# Patient Record
Sex: Male | Born: 1960 | Race: Black or African American | Hispanic: No | Smoking: Never smoker
Health system: Southern US, Community
[De-identification: ages and names within clinical notes are randomized; demographics above are authoritative.]

## PROBLEM LIST (undated history)

## (undated) DIAGNOSIS — N419 Inflammatory disease of prostate, unspecified: Secondary | ICD-10-CM

## (undated) DIAGNOSIS — E785 Hyperlipidemia, unspecified: Secondary | ICD-10-CM

## (undated) DIAGNOSIS — M1990 Unspecified osteoarthritis, unspecified site: Secondary | ICD-10-CM

## (undated) DIAGNOSIS — K219 Gastro-esophageal reflux disease without esophagitis: Secondary | ICD-10-CM

## (undated) DIAGNOSIS — Z973 Presence of spectacles and contact lenses: Secondary | ICD-10-CM

## (undated) HISTORY — DX: Hyperlipidemia, unspecified: E78.5

## (undated) HISTORY — DX: Presence of spectacles and contact lenses: Z97.3

## (undated) HISTORY — DX: Inflammatory disease of prostate, unspecified: N41.9

## (undated) HISTORY — DX: Unspecified osteoarthritis, unspecified site: M19.90

## (undated) HISTORY — DX: Gastro-esophageal reflux disease without esophagitis: K21.9

---

## 2007-04-02 ENCOUNTER — Emergency Department (HOSPITAL_COMMUNITY): Admission: EM | Admit: 2007-04-02 | Discharge: 2007-04-02 | Payer: Self-pay | Admitting: *Deleted

## 2009-09-21 HISTORY — PX: COLONOSCOPY: SHX174

## 2011-07-07 LAB — LIPASE, BLOOD: Lipase: 27

## 2011-07-07 LAB — CBC
HCT: 44.4
Hemoglobin: 15.1
RDW: 13.4

## 2011-07-07 LAB — POCT CARDIAC MARKERS
CKMB, poc: 1.7
Myoglobin, poc: 120
Operator id: 284141
Operator id: 284141
Troponin i, poc: <0.05

## 2011-07-07 LAB — DIFFERENTIAL
Basophils Absolute: 0
Basophils Relative: 1
Neutro Abs: 1.7
Neutrophils Relative %: 44

## 2011-07-07 LAB — COMPREHENSIVE METABOLIC PANEL
Alkaline Phosphatase: 76
BUN: 11
Chloride: 106
Glucose, Bld: 76
Potassium: 3.9
Total Bilirubin: 1.3 — ABNORMAL HIGH
Total Protein: 6.7

## 2011-07-07 LAB — I-STAT 8, (EC8 V) (CONVERTED LAB)
Acid-Base Excess: 2
Chloride: 109
Glucose, Bld: 80
Potassium: 3.9
pH, Ven: 7.515 — ABNORMAL HIGH

## 2011-07-07 LAB — AMYLASE: Amylase: 90

## 2011-09-01 ENCOUNTER — Ambulatory Visit: Payer: Self-pay

## 2014-08-23 ENCOUNTER — Ambulatory Visit (INDEPENDENT_AMBULATORY_CARE_PROVIDER_SITE_OTHER): Payer: BC Managed Care – PPO | Admitting: Family Medicine

## 2014-08-23 VITALS — BP 132/84 | HR 78 | Temp 98.6°F | Resp 16 | Ht 72.5 in | Wt 218.6 lb

## 2014-08-23 DIAGNOSIS — H60392 Other infective otitis externa, left ear: Secondary | ICD-10-CM

## 2014-08-23 DIAGNOSIS — H9202 Otalgia, left ear: Secondary | ICD-10-CM

## 2014-08-23 MED ORDER — NEOMYCIN-POLYMYXIN-HC 1 % OT SOLN: OTIC | Status: DC

## 2014-08-23 NOTE — Patient Instructions (Signed)
Use 3-4 ear drops about 4 times daily in the left ear.   Return and see Benny LennertSarah Weber PA-C on Saturday between 8 and 3  Tylenol or ibuprofen for pain. Call if you need something stronger for pain.

## 2014-08-23 NOTE — Progress Notes (Signed)
Subjective: Patient is here complaining of pain in his left ear. He's had drainage from the year for the last few days.  Objective: He has small earlobes. His right ear though is normal. The left has a tightly swollen canal. It is draining a yellowish watery substance. This was dried up the best possible and a wick was inserted. The patient was treated with Cortisporin Otic drops. The with was saturated.  Assessment: Otitis externa Left otalgia  Plan: Continue using the drops at home and return in 2 days to have the wick removed and decide whether a new were needs to be inserted or not at that point. 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

## 2014-08-25 ENCOUNTER — Ambulatory Visit (INDEPENDENT_AMBULATORY_CARE_PROVIDER_SITE_OTHER): Payer: BC Managed Care – PPO | Admitting: Physician Assistant

## 2014-08-25 VITALS — BP 120/86 | HR 73 | Temp 98.3°F | Resp 12 | Ht 71.75 in | Wt 212.0 lb

## 2014-08-25 DIAGNOSIS — H60392 Other infective otitis externa, left ear: Secondary | ICD-10-CM

## 2014-08-25 NOTE — Progress Notes (Signed)
   Subjective:    Patient ID: Joshua Torres, male    DOB: [birth year], 53 y.o.   MRN: [id number]  HPI Pt presents to clinic for recheck of his left ear.  The ear feels better - he is still having tinnitus and he has muffled hearing.  He has been using his ear drops without problems.  Prior to Admission medications   Medication Sig Start Date End Date Taking? Authorizing Provider  NEOMYCIN-POLYMYXIN-HYDROCORTISONE (CORTISPORIN) 1 % SOLN otic solution Use 3 or 4 drops in left ear 4 times daily 08/23/14  Yes Peyton Najjaravid H Hopper, MD   No Known Allergies There are no active problems to display for this patient.    Review of Systems     Objective:   Physical Exam  Constitutional: He is oriented to person, place, and time. He appears well-developed and well-nourished.  BP 120/86 mmHg  Pulse 73  Temp(Src) 98.3 F (36.8 C) (Oral)  Resp 12  Ht 5' 11.75" (1.822 m)  Wt 212 lb (96.163 kg)  BMI 28.97 kg/m2  SpO2 99%   HENT:  Head: Normocephalic and atraumatic.  Right Ear: Hearing, tympanic membrane, external ear and ear canal normal.  Left Ear: Hearing, tympanic membrane and external ear normal. Left ear swelling: external canal is swollen without erythema, there is purulence in his canal.  Shonna ChockWick removed.  Left external ear canal is still swollen but it is patent.  Pulmonary/Chest: Effort normal.  Neurological: He is alert and oriented to person, place, and time.  Skin: Skin is warm and dry.  Psychiatric: He has a normal mood and affect. His behavior is normal. Judgment and thought content normal.       Assessment & Plan:  Otitis, externa, infective, left  Ear wick was not placed today because ear canal is patent and he should be able to get the drops in his ear without difficulty.  Benny LennertSarah Jiselle Sheu PA-C  [facility] 08/25/2014 10:51 AM

## 2015-04-01 ENCOUNTER — Encounter: Payer: Self-pay | Admitting: Medical

## 2015-04-01 ENCOUNTER — Ambulatory Visit (INDEPENDENT_AMBULATORY_CARE_PROVIDER_SITE_OTHER): Payer: BLUE CROSS/BLUE SHIELD | Admitting: Medical

## 2015-04-01 VITALS — BP 120/78 | HR 72 | Temp 98.2°F | Resp 16 | Ht 72.0 in | Wt 204.0 lb

## 2015-04-01 DIAGNOSIS — N410 Acute prostatitis: Secondary | ICD-10-CM | POA: Diagnosis not present

## 2015-04-01 LAB — POCT URINALYSIS DIPSTICK
BILIRUBIN UA: NEGATIVE
Blood, UA: NEGATIVE
GLUCOSE UA: NEGATIVE
Ketones, UA: NEGATIVE
LEUKOCYTES UA: NEGATIVE
NITRITE UA: NEGATIVE
Protein, UA: NEGATIVE
Spec Grav, UA: 1.025
UROBILINOGEN UA: NEGATIVE
pH, UA: 6

## 2015-04-01 MED ORDER — CIPROFLOXACIN HCL 500 MG PO TABS: 500.0000 mg | ORAL_TABLET | Freq: Two times a day (BID) | ORAL | Status: DC

## 2015-04-01 NOTE — Progress Notes (Signed)
Subjective: Here as a new patient. Was seeing [facility] prior.  He is here concerned about prostate infection.  He has had this 2 other prior times and current symptom are similar.  He notes 1.5 wk hx/o pain between legs/perineum area, hurts to sit for prolonged period of time.  No other significant symptoms.  No changes in urination, no burning with urination, no back pain, no abdominal pain, no fever, no NVD, no constipation, no penile discharge, no blood in urine, no concern for STD, married.  Last prostate exam > 2 years ago, last colonoscopy 2011/Dr. Loreta AveMann.  No prior hx/o diverticulitis, no hx/o orchitis, no prior UTI, no rectal pain specifically.  No other aggravating or relieving factors. No other complaint.  History reviewed. No pertinent past medical history.  ROS as in subjective  Objective: BP 120/78 mmHg  Pulse 72  Temp(Src) 98.2 F (36.8 C) (Oral)  Resp 16  Ht 6' (1.829 m)  Wt 204 lb (92.534 kg)  BMI 27.66 kg/m2  General appearance: alert, no distress, WD/WN, pleasant AA male Heart: RRR, normal S1, S2, no murmurs Lungs: CTA bilaterally, no wheezes, rhonchi, or rales Abdomen: +bs, soft, non tender, non distended, no masses, no hepatomegaly, no splenomegaly Back: non tender GU: normal male, circumcised, no mass, no hernia Rectal: normal sphincter tone, prostate slightly boggy, no discrete nodules, occult negative stool   Assessment: Encounter Diagnosis  Name Primary?  . Prostatitis, acute Yes    Plan: Discussed diagnosis, treatment, hydration, and begin Cipro.  If not improving in 7-10 days, call back, but plan on 14 days of antibiotics, longer if still symptomatic.  F/u in 55mo for physical as planned.

## 2015-04-04 ENCOUNTER — Telehealth: Payer: Self-pay | Admitting: Internal Medicine

## 2015-04-04 NOTE — Telephone Encounter (Signed)
[facility] [place] for pro[facility]

## 2015-04-10 ENCOUNTER — Telehealth: Payer: Self-pay | Admitting: Family Medicine

## 2015-04-10 NOTE — Telephone Encounter (Signed)
[facility] records from [facility].  Gave to [employer]

## 2015-04-22 HISTORY — PX: NO PAST SURGERIES: SHX2092

## 2015-04-29 ENCOUNTER — Ambulatory Visit (INDEPENDENT_AMBULATORY_CARE_PROVIDER_SITE_OTHER): Payer: BLUE CROSS/BLUE SHIELD | Admitting: Medical

## 2015-04-29 ENCOUNTER — Encounter: Payer: Self-pay | Admitting: Medical

## 2015-04-29 ENCOUNTER — Telehealth: Payer: Self-pay | Admitting: Medical

## 2015-04-29 VITALS — BP 130/90 | HR 76 | Temp 97.9°F | Resp 16 | Ht 73.0 in | Wt 204.4 lb

## 2015-04-29 DIAGNOSIS — N410 Acute prostatitis: Secondary | ICD-10-CM

## 2015-04-29 DIAGNOSIS — Z125 Encounter for screening for malignant neoplasm of prostate: Secondary | ICD-10-CM

## 2015-04-29 DIAGNOSIS — Z1211 Encounter for screening for malignant neoplasm of colon: Secondary | ICD-10-CM

## 2015-04-29 DIAGNOSIS — H18413 Arcus senilis, bilateral: Secondary | ICD-10-CM | POA: Diagnosis not present

## 2015-04-29 DIAGNOSIS — Z23 Encounter for immunization: Secondary | ICD-10-CM | POA: Diagnosis not present

## 2015-04-29 DIAGNOSIS — Z0000 Encounter for general adult medical examination without abnormal findings: Secondary | ICD-10-CM

## 2015-04-29 LAB — POCT URINALYSIS DIPSTICK
BILIRUBIN UA: NEGATIVE
GLUCOSE UA: NEGATIVE
Ketones, UA: NEGATIVE
Leukocytes, UA: NEGATIVE
Nitrite, UA: NEGATIVE
Protein, UA: NEGATIVE
RBC UA: NEGATIVE
Spec Grav, UA: 1.015
Urobilinogen, UA: NEGATIVE
pH, UA: 6

## 2015-04-29 LAB — CBC
HCT: 44.6 % (ref 39.0–52.0)
HEMOGLOBIN: 14.8 g/dL (ref 13.0–17.0)
MCH: 28.8 pg (ref 26.0–34.0)
MCHC: 33.2 g/dL (ref 30.0–36.0)
MCV: 86.9 fL (ref 78.0–100.0)
MPV: 10.8 fL (ref 8.6–12.4)
PLATELETS: 149 10*3/uL — AB (ref 150–400)
RBC: 5.13 MIL/uL (ref 4.22–5.81)
RDW: 14.1 % (ref 11.5–15.5)
WBC: 4.6 10*3/uL (ref 4.0–10.5)

## 2015-04-29 LAB — COMPREHENSIVE METABOLIC PANEL
ALK PHOS: 71 U/L (ref 40–115)
ALT: 20 U/L (ref 9–46)
AST: 20 U/L (ref 10–35)
Albumin: 4 g/dL (ref 3.6–5.1)
BILIRUBIN TOTAL: 1.2 mg/dL (ref 0.2–1.2)
BUN: 14 mg/dL (ref 7–25)
CALCIUM: 9.2 mg/dL (ref 8.6–10.3)
CO2: 26 mmol/L (ref 20–31)
Chloride: 104 mmol/L (ref 98–110)
Creat: 1.06 mg/dL (ref 0.70–1.33)
GLUCOSE: 83 mg/dL (ref 65–99)
POTASSIUM: 4.1 mmol/L (ref 3.5–5.3)
SODIUM: 141 mmol/L (ref 135–146)
Total Protein: 6.6 g/dL (ref 6.1–8.1)

## 2015-04-29 LAB — LIPID PANEL
CHOL/HDL RATIO: 4.1 ratio (ref <=5.0)
Cholesterol: 250 mg/dL — ABNORMAL HIGH (ref 125–200)
HDL: 61 mg/dL (ref >=40)
LDL Cholesterol: 164 mg/dL — ABNORMAL HIGH (ref <130)
TRIGLYCERIDES: 127 mg/dL (ref <150)
VLDL: 25 mg/dL (ref <30)

## 2015-04-29 MED ORDER — CIPROFLOXACIN HCL 500 MG PO TABS: 500.0000 mg | ORAL_TABLET | Freq: Two times a day (BID) | ORAL | Status: DC

## 2015-04-29 NOTE — Progress Notes (Signed)
Subjective:   HPI  Joshua Torres is a 54 y.o. male who presents for a complete physical.   Preventative care: Last ophthalmology visit: yearly Last colonoscopy: 5 years ago, due now Last prostate exam: recently last visit Last EKG: unknown Last labs: today, fasting  Prior vaccinations: TD or Tdap: ?  Concerns: Only concern is after last visit, felt much better on cipro x 2 wk, then as soon as he ran out symptoms came back.  He is here concerned about ongoing prostate infection. He has had this 2 other prior times and current symptom are similar. He notes prior to last visit 1.5 wk hx/o pain between legs/perineum area, hurts to sit for prolonged period of time. No other significant symptoms. No changes in urination, no burning with urination, no back pain, no abdominal pain, no fever, no NVD, no constipation, no penile discharge, no blood in urine, no concern for STD, married. Last prostate exam > 2 years ago, last colonoscopy 2011/Dr. Loreta Ave. No prior hx/o diverticulitis, no hx/o orchitis, no prior UTI, no rectal pain specifically. No other aggravating or relieving factors. No other complaint.  Reviewed their medical, surgical, family, social, medication, and allergy history and updated chart as appropriate.  Past Medical History  Diagnosis Date  . Wears glasses   . GERD (gastroesophageal reflux disease)   . Prostatitis   . Osteoarthritis     left middle finger    Past Surgical History  Procedure Laterality Date  . No past surgeries  04/2015  . Colonoscopy  2011    Dr. Loreta Ave, q5 years, so due as of 2016    History   Social History  . Marital Status: Married    Spouse Name: N/A  . Number of Children: N/A  . Years of Education: N/A   Occupational History  . Not on file.   Social History Main Topics  . Smoking status: Never Smoker   . Smokeless tobacco: Never Used  . Alcohol Use: No  . Drug Use: No  . Sexual Activity: Not on file   Other Topics Concern  . 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   Exercise - stretching, walking.  Attends church.      Family History  Problem Relation Age of Onset  . Cancer Father     brain  . Stroke Father   . Diabetes Sister   . Diabetes Mother   . Other Brother     death related to a fall, head contusion  . Asthma Sister   . Heart disease Neg Hx      Current outpatient prescriptions:  .  ciprofloxacin (CIPRO) 500 MG tablet, Take 1 tablet (500 mg total) by mouth 2 (two) times daily., Disp: 42 tablet, Rfl: 0  No Known Allergies   Review of Systems Constitutional: -fever, -chills, -sweats, -unexpected weight change, -decreased appetite, -fatigue Allergy: -sneezing, -itching, -congestion Dermatology: -changing moles, --rash, -lumps ENT: -runny nose, -ear pain, -sore throat, -hoarseness, -sinus pain, -teeth pain, - ringing in ears, -hearing loss, -nosebleeds Cardiology: -chest pain, -palpitations, -swelling, -difficulty breathing when lying flat, -waking up short of breath Respiratory: -cough, -shortness of breath, -difficulty breathing with exercise or exertion, -wheezing, -coughing up blood Gastroenterology: -abdominal pain, -nausea, -vomiting, -diarrhea, -constipation, -blood in stool, -changes in bowel movement, -difficulty swallowing or eating Hematology: -bleeding, -bruising  Musculoskeletal: -joint aches, -muscle aches, -joint swelling, -back pain, -neck pain, -cramping, -changes in gait Ophthalmology: denies vision changes,  eye redness, itching, discharge Urology: -burning with urination, -difficulty urinating, -blood in urine, -urinary frequency, -urgency, -incontinence Neurology: -headache, -weakness, -tingling, -numbness, -memory loss, -falls, -dizziness Psychology: -depressed mood, -agitation, -sleep problems     Objective:   Physical Exam  BP 130/90 mmHg  Pulse 76  Temp(Src) 97.9 F (36.6 C)  Resp 16  Ht  (1.854 m)  Wt 204 lb  6.4 oz (92.715 kg)  BMI 26.97 kg/m2  General appearance: alert, no distress, WD/WN, AA male Skin: left dorsal hand with 3mm brown flat macule unchanged per patient, left upper abdomen 2mm brown flat diamond shape macule, otherwise few scattered macules, no worrisome lesions HEENT: normocephalic, bluish hugh concentric rings around perimeter of both iris, conjunctiva/corneas normal, sclerae anicteric, PERRLA, EOMi, nares patent, no discharge or erythema, pharynx normal Oral cavity: MMM, tongue normal, teeth in good repair Neck: supple, no lymphadenopathy, no thyromegaly, no masses, normal ROM Chest: non tender, normal shape and expansion Heart: RRR, normal S1, S2, no murmurs Lungs: CTA bilaterally, no wheezes, rhonchi, or rales Abdomen: +bs, soft, non tender, non distended, no masses, no hepatomegaly, no splenomegaly, no bruits Back: non tender, normal ROM, no scoliosis Musculoskeletal: upper extremities non tender, no obvious deformity, normal ROM throughout, lower extremities non tender, no obvious deformity, normal ROM throughout Extremities: no edema, no cyanosis, no clubbing Pulses: 2+ symmetric, upper and lower extremities, normal cap refill Neurological: alert, oriented x 3, CN2-12 intact, strength normal upper extremities and lower extremities, sensation normal throughout, DTRs 2+ throughout, no cerebellar signs, gait normal Psychiatric: normal affect, behavior normal, pleasant  GU: normal male external genitalia, circumcised, nontender, no masses, no hernia, no lymphadenopathy Rectal: deferred, done last visit   Adult ECG Report  Indication: physical  Rate: 65 bpm  Rhythm: normal sinus rhythm  QRS Axis: 24 degrees  PR Interval: 152 ms  QRS Duration: 96ms  QTc:  Conduction Disturbances: none  Other Abnormalities: none  Patient's cardiac risk factors are: dyslipidemia and male gender.  EKG comparison: none  Narrative Interpretation: normal EKG    Assessment and Plan :     Encounter Diagnoses  Name Primary?  . Encounter for health maintenance examination in adult Yes  . Acute prostatitis   . Special screening for malignant neoplasms, colon   . Screening for prostate cancer   . Need for Tdap vaccination     Physical exam - discussed healthy lifestyle, diet, exercise, preventative care, vaccinations, and addressed their concerns.    Counseled on the Td (tetanus, diptheria) vaccine.  Vaccine information sheet given. Td vaccine given after consent obtained.  Recommendations:  See your eye doctor yearly for routine vision care.  See your dentist yearly for routine dental care including hygiene visits twice yearly.  We will refer you back to Dr. Loreta Ave for updated colonoscopy  Exercise regularly  Eat a healthy low fat diet  We will call with lab results  We updated your tetanus, diptheria vaccine today  Restart Cipro for prostatitis.   Plan to call back in 2-3 weeks to let me know how you are doing.    Follow-up pending labs

## 2015-04-29 NOTE — Patient Instructions (Signed)
Encounter Diagnoses  Name Primary?  . Encounter for health maintenance examination in adult Yes  . Acute prostatitis   . Special screening for malignant neoplasms, colon   . Screening for prostate cancer   . Need for Tdap vaccination    Recommendations:  See your eye doctor yearly for routine vision care.  See your dentist yearly for routine dental care including hygiene visits twice yearly.  We will refer you back to Dr. Loreta Ave for updated colonoscopy  Exercise regularly  Eat a healthy low fat diet  We will call with lab results  We updated your tetanus, diptheria and pertussis vaccine today  Restart Cipro for prostatitis.   Plan to call back in 2-3 weeks to let me know how you are doing.

## 2015-04-29 NOTE — Telephone Encounter (Signed)
Refer back to Dr. Mann for updated colonoscopy 

## 2015-04-30 ENCOUNTER — Other Ambulatory Visit: Payer: Self-pay | Admitting: Medical

## 2015-04-30 ENCOUNTER — Other Ambulatory Visit: Payer: Self-pay

## 2015-04-30 LAB — PSA: PSA: 0.64 ng/mL (ref <=4.00)

## 2015-04-30 MED ORDER — ATORVASTATIN CALCIUM 20 MG PO TABS: 20.0000 mg | ORAL_TABLET | Freq: Every day | ORAL | Status: DC

## 2015-04-30 NOTE — Telephone Encounter (Signed)
PT IS NOT DUE TILL 09/2016 PER DR.[facility] OFFICE

## 2015-05-01 NOTE — Telephone Encounter (Signed)
Ok, make sure he is aware

## 2015-05-02 NOTE — Telephone Encounter (Signed)
Pt is aware.  

## 2015-05-28 ENCOUNTER — Telehealth: Payer: Self-pay | Admitting: Medical

## 2015-05-28 NOTE — Telephone Encounter (Signed)
Pt states that Cipro worked initially however all same symptoms have came back now. Can he get a different med?

## 2015-05-29 ENCOUNTER — Other Ambulatory Visit: Payer: Self-pay | Admitting: Medical

## 2015-05-29 MED ORDER — DOXYCYCLINE HYCLATE 50 MG PO CAPS: 50.0000 mg | ORAL_CAPSULE | Freq: Two times a day (BID) | ORAL | Status: DC

## 2015-05-29 NOTE — Telephone Encounter (Signed)
Begin Doxycycline and f/u in 2wk.  schedule him an OV

## 2015-05-29 NOTE — Telephone Encounter (Signed)
Called pt to advise that med sent to pharmacy. Scheduled 2 wk fu appt

## 2015-06-12 ENCOUNTER — Encounter: Payer: Self-pay | Admitting: Medical

## 2015-06-12 ENCOUNTER — Ambulatory Visit (INDEPENDENT_AMBULATORY_CARE_PROVIDER_SITE_OTHER): Payer: BLUE CROSS/BLUE SHIELD | Admitting: Medical

## 2015-06-12 VITALS — BP 130/94 | HR 72 | Temp 98.3°F | Resp 16 | Wt 210.0 lb

## 2015-06-12 DIAGNOSIS — N411 Chronic prostatitis: Secondary | ICD-10-CM | POA: Diagnosis not present

## 2015-06-12 MED ORDER — TAMSULOSIN HCL 0.4 MG PO CAPS: 0.4000 mg | ORAL_CAPSULE | Freq: Every day | ORAL | Status: DC

## 2015-06-12 NOTE — Progress Notes (Signed)
Subjective: Chief Complaint  Patient presents with  . Follow-up    med check   Since July has had recurrent prostatitis despite 2 rounds of Cipro, currently halfway throughout doxycyline round, and improved, but worried about recurrence when he stops the doxy.  Sex seems to aggravate symptoms.  He is married.  He has had 2 other prior flares of prostatitis before 03/2015.    Past Medical History  Diagnosis Date  . Wears glasses   . GERD (gastroesophageal reflux disease)   . Prostatitis   . Osteoarthritis     left middle finger   ROS as in subjective   Objective: BP 130/94 mmHg  Pulse 72  Temp(Src) 98.3 F (36.8 C)  Resp 16  Wt 210 lb (95.255 kg)  Gen: wd, wn, nad   Assessment: Encounter Diagnosis  Name Primary?  . Chronic prostatitis Yes     Plan: Finish doxycyline. Of note he has completed 2 rounds of cipro since July, and halfwary through round of Doxycycline.   Begin Flomax.   abstains for sex or masturbation.   Hydrate well.  If not much improved in 6wk, then refer to urology  F/u in December fasting for hyperlipidemia/med check.

## 2015-06-13 ENCOUNTER — Encounter: Payer: Self-pay | Admitting: Medical

## 2016-09-21 HISTORY — PX: COLONOSCOPY: SHX174

## 2016-11-23 ENCOUNTER — Ambulatory Visit (INDEPENDENT_AMBULATORY_CARE_PROVIDER_SITE_OTHER): Payer: BLUE CROSS/BLUE SHIELD | Admitting: Medical

## 2016-11-23 ENCOUNTER — Encounter: Payer: Self-pay | Admitting: Medical

## 2016-11-23 VITALS — BP 126/82 | HR 75 | Wt 209.6 lb

## 2016-11-23 DIAGNOSIS — N419 Inflammatory disease of prostate, unspecified: Secondary | ICD-10-CM

## 2016-11-23 LAB — POCT URINALYSIS DIPSTICK
Bilirubin, UA: NEGATIVE
Glucose, UA: NEGATIVE
Ketones, UA: NEGATIVE
LEUKOCYTES UA: NEGATIVE
NITRITE UA: NEGATIVE
PH UA: 6
PROTEIN UA: NEGATIVE
RBC UA: NEGATIVE
Spec Grav, UA: 1.02
UROBILINOGEN UA: NEGATIVE

## 2016-11-23 MED ORDER — CIPROFLOXACIN HCL 500 MG PO TABS: 500.0000 mg | ORAL_TABLET | Freq: Two times a day (BID) | ORAL | 0 refills | Status: DC

## 2016-11-23 NOTE — Addendum Note (Signed)
Addended by: Winn JockVALENTINE, Scott Fix N on: 11/23/2016 04:40 PM   Modules accepted: Orders

## 2016-11-23 NOTE — Progress Notes (Signed)
Subjective: Chief Complaint  Patient presents with  . postate infection    postate infection    Here for possible prostate infection.   He reports pain in under scrotum, rectum, hurts to sit down.   No urine changes.   No burning with urination, no urinary frequency.  Does have some urgency maybe?  No blood in urine, no urine color changes.   No urine stream changes.   No fever. Has a bad back in general, so no worse back pain.   No belly pain.  No testicle swelling or pain.  No pain with wiping.  Has BM about every other day. No blood on toilet paper.   Was on Flomax daily but quit taking this 8 months ago.   Can't say that Flomax was helping anything.  No genital rash.  No other aggravating or relieving factors. No other complaint.   Past Medical History:  Diagnosis Date  . GERD (gastroesophageal reflux disease)   . Osteoarthritis    left middle finger  . Prostatitis   . Wears glasses    Current Outpatient Prescriptions on File Prior to Visit  Medication Sig Dispense Refill  . atorvastatin (LIPITOR) 20 MG tablet Take 1 tablet (20 mg total) by mouth daily. (Patient not taking: Reported on 11/23/2016) 90 tablet 1  . doxycycline (VIBRAMYCIN) 50 MG capsule Take 1 capsule (50 mg total) by mouth 2 (two) times daily. (Patient not taking: Reported on 11/23/2016) 28 capsule 0  . tamsulosin (FLOMAX) 0.4 MG CAPS capsule Take 1 capsule (0.4 mg total) by mouth daily. (Patient not taking: Reported on 11/23/2016) 90 capsule 0   No current facility-administered medications on file prior to visit.    ROS as in subjective   Objective: BP 126/82   Pulse 75   Wt 209 lb 9.6 oz (95.1 kg)   SpO2 99%   BMI 27.65 kg/m   Gen: wd, wn, nad Skin: unremarkable Abdomen: +bs, soft, nontender, no mass, no organomegaly GU: normal male, circumcised, nontender, no mass, no lymphadenopathy, small bulge in right inguinal region, possible small inguinal hernia Declined rectal   Assessment Encounter Diagnosis  Name  Primary?  . Prostatitis, unspecified prostatitis type Yes    Plan Reviewed 2016 notes for prostatitis visits.  He has used cipro and doxycycline prior for same.  Begin trial of cipro.  Call report within 2 wk.  He declines improvements on Flomax prior, so hold off on this for now.     F/u 2wk.

## 2016-12-03 ENCOUNTER — Telehealth: Payer: Self-pay | Admitting: Medical

## 2016-12-03 NOTE — Telephone Encounter (Signed)
Pt called and wanted to give you a call report, states he feels better pain level is only a 3 out of 10,, states he has 5 days left of the cipro, pt can be reached at [phone]

## 2016-12-04 ENCOUNTER — Other Ambulatory Visit: Payer: Self-pay | Admitting: Medical

## 2016-12-04 MED ORDER — CIPROFLOXACIN HCL 500 MG PO TABS: 500.0000 mg | ORAL_TABLET | Freq: Two times a day (BID) | ORAL | 0 refills | Status: DC

## 2016-12-04 NOTE — Telephone Encounter (Signed)
Ok, lets go another round on cipro.  Med sent

## 2016-12-11 ENCOUNTER — Telehealth: Payer: Self-pay | Admitting: Medical

## 2016-12-11 ENCOUNTER — Other Ambulatory Visit: Payer: Self-pay | Admitting: Medical

## 2016-12-11 MED ORDER — CIPROFLOXACIN HCL 500 MG PO TABS: 500.0000 mg | ORAL_TABLET | Freq: Two times a day (BID) | ORAL | 0 refills | Status: DC

## 2016-12-11 NOTE — Telephone Encounter (Signed)
Do another round of cipro and f/u 2wk.   Med sent in

## 2016-12-11 NOTE — Telephone Encounter (Signed)
Pt states was prostate getting better, has taken all the meds, now getting worse again every day, re-occuring.  Didn't completely go away.  Wants to know if you want to refill or change to something different.  [facility]

## 2016-12-14 ENCOUNTER — Encounter: Payer: Self-pay | Admitting: Medical

## 2016-12-14 ENCOUNTER — Ambulatory Visit (INDEPENDENT_AMBULATORY_CARE_PROVIDER_SITE_OTHER): Payer: BLUE CROSS/BLUE SHIELD | Admitting: Medical

## 2016-12-14 VITALS — BP 138/88 | HR 79 | Wt 208.4 lb

## 2016-12-14 DIAGNOSIS — T50905A Adverse effect of unspecified drugs, medicaments and biological substances, initial encounter: Secondary | ICD-10-CM

## 2016-12-14 DIAGNOSIS — N411 Chronic prostatitis: Secondary | ICD-10-CM

## 2016-12-14 DIAGNOSIS — M7660 Achilles tendinitis, unspecified leg: Secondary | ICD-10-CM | POA: Diagnosis not present

## 2016-12-14 DIAGNOSIS — T887XXA Unspecified adverse effect of drug or medicament, initial encounter: Secondary | ICD-10-CM | POA: Diagnosis not present

## 2016-12-14 DIAGNOSIS — M6788 Other specified disorders of synovium and tendon, other site: Secondary | ICD-10-CM

## 2016-12-14 MED ORDER — DOXYCYCLINE HYCLATE 100 MG PO TABS: 100.0000 mg | ORAL_TABLET | Freq: Two times a day (BID) | ORAL | 0 refills | Status: DC

## 2016-12-14 MED ORDER — FINASTERIDE 5 MG PO TABS: 5.0000 mg | ORAL_TABLET | Freq: Every day | ORAL | 2 refills | Status: DC

## 2016-12-14 NOTE — Progress Notes (Signed)
Subjective: Chief Complaint  Patient presents with  . reaction to antibotic    ankle pain and pain in pinky finger  this start on the second round    I saw him 11/23/16 for prostatitis.   Prior to this visit, he had completed 1 round of Cipro x 2 weeks, then he called back Friday, had gotten some better, but when medication ran out, prostate symptoms returned.  Since he started the second round of Cipro that was just called out been getting pain in achilles, worse first thin in the morning.  No recent exercise that was aggravating the feet or ankles.  Also getting pain in left 5th pinky finger, feels like its out of joint.  Improves throughout the day.   Symptom including pain in perineum, worse if sitting.  Is urinating more now.   At night ok, but worse during the work day.   Drinks a lot of water in general.   No burning with urination. No blood in urine, no urine color changes.   No urine stream changes.  sometimes stream is weaker than at other times.  No fever. Has a bad back in general, so no worse back pain.   No belly pain.  No testicle swelling or pain.  No pain with wiping.  Has BM about every other day. No blood on toilet paper.  Bowels seems to be fine.  Was on Flomax daily but quit taking this 8 months ago.   Can't say that Flomax was helping anything.  No genital rash.  No other aggravating or relieving factors. No other complaint.  Past Medical History:  Diagnosis Date  . GERD (gastroesophageal reflux disease)   . Osteoarthritis    left middle finger  . Prostatitis   . Wears glasses    No current outpatient prescriptions on file prior to visit.   No current facility-administered medications on file prior to visit.    Past Surgical History:  Procedure Laterality Date  . COLONOSCOPY  2011   Dr. Loreta Ave, q5 years, so due as of 2016  . NO PAST SURGERIES  04/2015    ROS as in subjective   Objective: BP 138/88   Pulse 79   Wt 208 lb 6.4 oz (94.5 kg)   SpO2 98%   BMI 27.50  kg/m   Wt Readings from Last 3 Encounters:  12/14/16 208 lb 6.4 oz (94.5 kg)  11/23/16 209 lb 9.6 oz (95.1 kg)  06/12/15 210 lb (95.3 kg)   Gen: wd, wn, nad Skin: unremarkable Abdomen: +bs, soft, nontender, no mass, no organomegaly Back: nontender MSK: bilat 5th finger distal phalanx flexion deformity symmetrically otherwise arms, hands, hips, legs ankles without deformity, normal ROM,no swelling UE and LE neurovascularly intact GU: normal male, circumcised, nontender, no mass, no lymphadenopathy, small bulge in right inguinal region, possible small inguinal hernia DRE: anus normal tone, normal appearing, prostate mildly enlarged, no nodules, no swelling    Assessment Encounter Diagnoses  Name Primary?  . Chronic prostatitis Yes  . Achilles tendinosis   . Adverse effect of drug, initial encounter     Plan Reviewed 2016 notes for prostatitis visits.  He has used Cipro and doxycycline prior for same. Stop Cipro given the tendon symptoms. discussed potential risks of Cipro. Begin doxycyline, and begin Proscar.  Discussed risks/benefits of proscar, proper use of medication.   he never really noticed a benefit with Flomax.       Joshua Torres was seen today for reaction to antibotic.  Diagnoses  and all orders for this visit:  Chronic prostatitis  Achilles tendinosis  Adverse effect of drug, initial encounter  Other orders -     doxycycline (VIBRA-TABS) 100 MG tablet; Take 1 tablet (100 mg total) by mouth 2 (two) times daily. -     finasteride (PROSCAR) 5 MG tablet; Take 1 tablet (5 mg total) by mouth daily.   F/u 2 weeks

## 2017-01-04 ENCOUNTER — Ambulatory Visit: Payer: Self-pay | Admitting: Medical

## 2017-01-11 ENCOUNTER — Encounter: Payer: Self-pay | Admitting: Medical

## 2017-01-11 ENCOUNTER — Ambulatory Visit (INDEPENDENT_AMBULATORY_CARE_PROVIDER_SITE_OTHER): Payer: BLUE CROSS/BLUE SHIELD | Admitting: Medical

## 2017-01-11 ENCOUNTER — Telehealth: Payer: Self-pay | Admitting: Medical

## 2017-01-11 VITALS — BP 126/74 | HR 64 | Wt 209.6 lb

## 2017-01-11 DIAGNOSIS — N401 Enlarged prostate with lower urinary tract symptoms: Secondary | ICD-10-CM

## 2017-01-11 DIAGNOSIS — N411 Chronic prostatitis: Secondary | ICD-10-CM | POA: Diagnosis not present

## 2017-01-11 DIAGNOSIS — N3943 Post-void dribbling: Secondary | ICD-10-CM | POA: Diagnosis not present

## 2017-01-11 DIAGNOSIS — K921 Melena: Secondary | ICD-10-CM | POA: Diagnosis not present

## 2017-01-11 DIAGNOSIS — K6289 Other specified diseases of anus and rectum: Secondary | ICD-10-CM

## 2017-01-11 MED ORDER — HYDROCORTISONE ACETATE 25 MG RE SUPP: 25.0000 mg | Freq: Two times a day (BID) | RECTAL | 0 refills | Status: DC

## 2017-01-11 NOTE — Progress Notes (Signed)
Subjective: Chief Complaint  Patient presents with  . discuss prostate     discuss prostate    Here for recheck on prostate issues. I treated him twice in March, once with Cipro, once with doxycycline for prostatitis.  He still notes some pelvic and rectal discomfort and post void dribbling.  Since last visit after starting finasteride he notes improvements in nocturia and stream, but still having dribbling.   He has new c/o having some blood in stool as of recent, typically bright red.  Just started 3 weeks ago.  Just sees this on toilet paper.   No rectal pain with defecation, but does have pain sitting or after defecating.  Denies hemorrhoid poking out.   No prior hemorrhoid surgery.   Last colonoscopy 5 years ago, has repeat 02/01/17.  Just saw Dr. Loreta Ave for consult on this.  No other urinary c/o.  No other aggravating or relieving factors. No other complaint.  Past Medical History:  Diagnosis Date  . GERD (gastroesophageal reflux disease)   . Osteoarthritis    left middle finger  . Prostatitis   . Wears glasses    Current Outpatient Prescriptions on File Prior to Visit  Medication Sig Dispense Refill  . finasteride (PROSCAR) 5 MG tablet Take 1 tablet (5 mg total) by mouth daily. 30 tablet 2   No current facility-administered medications on file prior to visit.    ROS as in subjective  Objective: BP 126/74   Pulse 64   Wt 209 lb 9.6 oz (95.1 kg)   SpO2 97%   BMI 27.65 kg/m   Gen: wd, wn, nad Abdomen: nontender, no mass, no organomegaly Back nontender Anus normal tone, nontender, no obvious external hemorrhoids   Assessment Encounter Diagnoses  Name Primary?  . Chronic prostatitis Yes  . Rectal discomfort   . Blood in stool   . Benign prostatic hyperplasia with post-void dribbling     Plan: PSA lab today, refer to urology  He has appt for colonoscopy in mid May Advised good water and fiber intake C/t finasteride that we recently started Will request records (labs  and notes) from Dr. Lavonia Drafts.    He can use Anusol suppository prn for blood in stool, likely internal hemorrhoid, but can't rule out other, so f/u with GI as scheduled  Tayvin was seen today for discuss prostate.  Diagnoses and all orders for this visit:  Chronic prostatitis -     PSA -     Ambulatory referral to Urology  Rectal discomfort -     PSA -     Ambulatory referral to Urology  Blood in stool  Benign prostatic hyperplasia with post-void dribbling -     PSA -     Ambulatory referral to Urology  Other orders -     hydrocortisone (ANUSOL-HC) 25 MG suppository; Place 1 suppository (25 mg total) rectally 2 (two) times daily.

## 2017-01-11 NOTE — Telephone Encounter (Signed)
Pt called and states Anusol is too expensive.  Can you call something else in?  Please advise pt [phone] or [phone]

## 2017-01-11 NOTE — Telephone Encounter (Signed)
pls call pharmacy and see if they have generic hydrocortisone suppository that is cheaper?  Make sure we referred today to urology

## 2017-01-12 ENCOUNTER — Other Ambulatory Visit: Payer: Self-pay | Admitting: Medical

## 2017-01-12 LAB — PSA: PSA: 0.2 ng/mL (ref <=4.0)

## 2017-01-12 MED ORDER — HYDROCORTISONE 2.5 % RE CREA: 1.0000 "application " | TOPICAL_CREAM | Freq: Two times a day (BID) | RECTAL | 0 refills | Status: DC

## 2017-01-12 NOTE — Telephone Encounter (Signed)
I sent a cream that can be used externally, but what was cost of the cheapest suppositories?  Are the foams any cheaper.   He has inflamed internal hemorrhoids, and the cream I sent as an option isn't going to do much for internal hemorrhoid.  It would be helpful if the pharmacist gave [name] some options

## 2017-01-12 NOTE — Telephone Encounter (Signed)
Called and spoke with Morrie Sheldon the Pharmacist at [facility] she said that all them at expensive, she that is the generic.

## 2017-01-13 ENCOUNTER — Other Ambulatory Visit: Payer: Self-pay | Admitting: Medical

## 2017-01-13 MED ORDER — PRAMOXINE HCL 1 % RE FOAM: 1.0000 "application " | Freq: Three times a day (TID) | RECTAL | 0 refills | Status: DC | PRN

## 2017-01-13 NOTE — Telephone Encounter (Signed)
I sent a cream or a foam to use.  The foam could be used internal possibly, but unfortunately the suppositories which is what I prefer are too expensive regarding blood in stool and internal hemorrhoids.    Refer as discussed yesterday (see yesterday's phone messages if we haven't referred already

## 2017-01-13 NOTE — Telephone Encounter (Signed)
She said that all  Them are high in cost.

## 2017-01-13 NOTE — Telephone Encounter (Signed)
No the cost $ 110.00 for 12 suppositories.

## 2017-01-13 NOTE — Telephone Encounter (Signed)
Called and let pt know, already sent referral

## 2017-01-14 ENCOUNTER — Telehealth: Payer: Self-pay | Admitting: Medical

## 2017-01-14 NOTE — Telephone Encounter (Signed)
Called pt he said that he has already [facility].I did call [facility] they price was $35.00

## 2017-01-14 NOTE — Telephone Encounter (Signed)
Please call [facility] pharmacy and see if they have a suppository of hydrocortisone or other steroid for inflamed internal hemorrhoid.  Let them know that we tried to prescribe Anusol and proctors suppositories and was told it would be >$80 for either.  I suppose they may have a much cheaper option.  If the do, approve 1 suppository BID x 3-5 days prn for internal hemorrhoid/blood in stool, #12 with no refill

## 2017-01-19 HISTORY — PX: COLONOSCOPY: SHX174

## 2017-01-28 ENCOUNTER — Telehealth: Payer: Self-pay

## 2017-01-28 ENCOUNTER — Other Ambulatory Visit: Payer: Self-pay | Admitting: Medical

## 2017-01-28 MED ORDER — FINASTERIDE 5 MG PO TABS: 5.0000 mg | ORAL_TABLET | Freq: Every day | ORAL | 3 refills | Status: DC

## 2017-01-28 NOTE — Telephone Encounter (Signed)
[facility]ax [facility]. Trixie Rude/RLB

## 2017-02-01 LAB — HM COLONOSCOPY

## 2017-02-02 ENCOUNTER — Encounter: Payer: Self-pay | Admitting: Medical

## 2017-03-15 ENCOUNTER — Encounter: Payer: Self-pay | Admitting: Medical

## 2017-11-29 ENCOUNTER — Ambulatory Visit: Payer: BLUE CROSS/BLUE SHIELD | Admitting: Medical

## 2017-11-29 VITALS — BP 126/78 | HR 78 | Wt 209.8 lb

## 2017-11-29 DIAGNOSIS — M545 Low back pain: Secondary | ICD-10-CM

## 2017-11-29 DIAGNOSIS — G8929 Other chronic pain: Secondary | ICD-10-CM | POA: Diagnosis not present

## 2017-11-29 DIAGNOSIS — R202 Paresthesia of skin: Secondary | ICD-10-CM | POA: Diagnosis not present

## 2017-11-29 DIAGNOSIS — N411 Chronic prostatitis: Secondary | ICD-10-CM | POA: Diagnosis not present

## 2017-11-29 NOTE — Progress Notes (Signed)
Subjective: Chief Complaint  Patient presents with  . having pain and burning feet    burning ,and pain , numbness , night sweats feet x 2 month    Here for a few different concerns.  Went to urgent care recently for prostate infection, was treated with medications.   He has seen urology in the past and recently had follow-up with urology.  He is doing physical therapy per urology recommendations for pelvic issues.  About 4 weeks into the medication doxycycline for prostate infection, started getting burning sensation across his toes.  Getting burning and numbness in feet. These symptoms started end of January.  He denies prior tingling or numbness in the feet.  He is also having some tingling in his lower legs.  He denies history of diabetes, does not use alcohol.  He does have a history of back issues for 14 years.   No prior back surgery.   Lower disc injury years ago.  Gets the sensation in both legs, worse in right.  Burning.  History of epidural steroid injection years ago.  In the last month is felt a little feverish, but that is also the period of time he has had the prostate infection.  Denies history of tuberculosis exposure or risk of tuberculosis exposure  Past Medical History:  Diagnosis Date  . GERD (gastroesophageal reflux disease)   . Osteoarthritis    left middle finger  . Prostatitis   . Wears glasses    Current Outpatient Medications on File Prior to Visit  Medication Sig Dispense Refill  . finasteride (PROSCAR) 5 MG tablet Take 1 tablet (5 mg total) by mouth daily. (Patient not taking: Reported on 11/29/2017) 90 tablet 3  . hydrocortisone (ANUSOL-HC) 2.5 % rectal cream Place 1 application rectally 2 (two) times daily. (Patient not taking: Reported on 11/29/2017) 30 g 0  . hydrocortisone (ANUSOL-HC) 25 MG suppository Place 1 suppository (25 mg total) rectally 2 (two) times daily. (Patient not taking: Reported on 11/29/2017) 12 suppository 0  . pramoxine (PROCTOFOAM) 1 % foam  Place 1 application rectally 3 (three) times daily as needed for itching. (Patient not taking: Reported on 11/29/2017) 15 g 0   No current facility-administered medications on file prior to visit.    Family History  Problem Relation Age of Onset  . Cancer Father        brain  . Stroke Father   . Diabetes Sister   . Diabetes Mother   . Other Brother        death related to a fall, head contusion  . Asthma Sister   . Heart disease Neg Hx    Past Surgical History:  Procedure Laterality Date  . COLONOSCOPY  2011   Dr. Loreta Ave, q5 years, so due as of 2016  . NO PAST SURGERIES  04/2015     ROS as in subjective   Objective: BP 126/78   Pulse 78   Wt 209 lb 12.8 oz (95.2 kg)   SpO2 99%   BMI 27.68 kg/m   General well-developed, well-nourished, no acute distress, African-American male  back non tender, normal range of motion, no deformity  legs non tender, normal range of motion, no swelling or deformity Neuro: Legs with normal sensation, strength and DTRs Lower extremity pulses 2+, normal cap refill Abdomen nontender, no mass, no organomegaly    Assessment: Encounter Diagnoses  Name Primary?  . Paresthesia Yes  . Chronic bilateral low back pain, with sciatica presence unspecified   . Chronic  prostatitis     Plan: We discussed the differential for paresthesias of the legs.  His pulses are normal blood flow appears to be fine.  His strength and sensation today was normal.  I suspect this could be related to chronic low back pain and radicular issue, but cannot rule out other.  Labs today as below.  Consider referral to neurology  Chronic prostatitis-follow-up with urology  Derryl HarborLonnie was seen today for having pain and burning feet.  Diagnoses and all orders for this visit:  Paresthesia -     CBC with Differential/Platelet -     Comprehensive metabolic panel -     Vitamin B12 -     TSH  Chronic bilateral low back pain, with sciatica presence unspecified  Chronic  prostatitis

## 2017-11-30 LAB — CBC WITH DIFFERENTIAL/PLATELET
BASOS ABS: 0.1 10*3/uL (ref 0.0–0.2)
BASOS: 1 %
EOS (ABSOLUTE): 0.1 10*3/uL (ref 0.0–0.4)
Eos: 2 %
Hematocrit: 44.3 % (ref 37.5–51.0)
Hemoglobin: 15.2 g/dL (ref 13.0–17.7)
IMMATURE GRANULOCYTES: 1 %
Immature Grans (Abs): 0 10*3/uL (ref 0.0–0.1)
Lymphocytes Absolute: 3.1 10*3/uL (ref 0.7–3.1)
Lymphs: 52 %
MCH: 29.1 pg (ref 26.6–33.0)
MCHC: 34.3 g/dL (ref 31.5–35.7)
MCV: 85 fL (ref 79–97)
MONOS ABS: 0.4 10*3/uL (ref 0.1–0.9)
Monocytes: 7 %
NEUTROS PCT: 37 %
Neutrophils Absolute: 2.2 10*3/uL (ref 1.4–7.0)
PLATELETS: 168 10*3/uL (ref 150–379)
RBC: 5.23 x10E6/uL (ref 4.14–5.80)
RDW: 14 % (ref 12.3–15.4)
WBC: 6 10*3/uL (ref 3.4–10.8)

## 2017-11-30 LAB — COMPREHENSIVE METABOLIC PANEL
ALT: 45 IU/L — AB (ref 0–44)
AST: 37 IU/L (ref 0–40)
Albumin/Globulin Ratio: 1.7 (ref 1.2–2.2)
Albumin: 4.8 g/dL (ref 3.5–5.5)
Alkaline Phosphatase: 120 IU/L — ABNORMAL HIGH (ref 39–117)
BUN/Creatinine Ratio: 14 (ref 9–20)
BUN: 13 mg/dL (ref 6–24)
Bilirubin Total: 0.7 mg/dL (ref 0.0–1.2)
CALCIUM: 9.3 mg/dL (ref 8.7–10.2)
CO2: 24 mmol/L (ref 20–29)
CREATININE: 0.9 mg/dL (ref 0.76–1.27)
Chloride: 104 mmol/L (ref 96–106)
GFR, EST AFRICAN AMERICAN: 109 mL/min/{1.73_m2} (ref >59)
GFR, EST NON AFRICAN AMERICAN: 94 mL/min/{1.73_m2} (ref >59)
GLUCOSE: 85 mg/dL (ref 65–99)
Globulin, Total: 2.9 g/dL (ref 1.5–4.5)
POTASSIUM: 3.7 mmol/L (ref 3.5–5.2)
Sodium: 143 mmol/L (ref 134–144)
TOTAL PROTEIN: 7.7 g/dL (ref 6.0–8.5)

## 2017-11-30 LAB — TSH: TSH: 3.81 u[IU]/mL (ref 0.450–4.500)

## 2017-11-30 LAB — VITAMIN B12: Vitamin B-12: 546 pg/mL (ref 232–1245)

## 2017-12-01 ENCOUNTER — Other Ambulatory Visit: Payer: Self-pay | Admitting: Medical

## 2017-12-01 DIAGNOSIS — R202 Paresthesia of skin: Secondary | ICD-10-CM

## 2017-12-01 DIAGNOSIS — R748 Abnormal levels of other serum enzymes: Secondary | ICD-10-CM

## 2017-12-02 ENCOUNTER — Encounter: Payer: Self-pay | Admitting: Internal Medicine

## 2017-12-02 ENCOUNTER — Other Ambulatory Visit: Payer: Self-pay | Admitting: Medical

## 2017-12-02 MED ORDER — DOXYCYCLINE HYCLATE 100 MG PO TABS: 100.0000 mg | ORAL_TABLET | Freq: Two times a day (BID) | ORAL | 0 refills | Status: DC

## 2017-12-08 ENCOUNTER — Encounter: Payer: Self-pay | Admitting: Neurology

## 2017-12-09 ENCOUNTER — Ambulatory Visit
Admission: RE | Admit: 2017-12-09 | Discharge: 2017-12-09 | Disposition: A | Discharge status: Home / Self Care | Payer: BLUE CROSS/BLUE SHIELD | Source: Ambulatory Visit | Attending: Medical | Admitting: Medical

## 2017-12-09 DIAGNOSIS — R748 Abnormal levels of other serum enzymes: Secondary | ICD-10-CM

## 2017-12-10 ENCOUNTER — Other Ambulatory Visit: Payer: Self-pay | Admitting: Medical

## 2017-12-10 ENCOUNTER — Telehealth: Payer: Self-pay

## 2017-12-10 ENCOUNTER — Encounter: Payer: Self-pay | Admitting: Medical

## 2017-12-10 DIAGNOSIS — R748 Abnormal levels of other serum enzymes: Secondary | ICD-10-CM

## 2017-12-10 MED ORDER — VITAMIN D 1000 UNITS PO TABS: 1000.0000 [IU] | ORAL_TABLET | Freq: Every day | ORAL | 3 refills | Status: DC

## 2017-12-10 MED ORDER — DOXYCYCLINE HYCLATE 100 MG PO TABS: 100.0000 mg | ORAL_TABLET | Freq: Two times a day (BID) | ORAL | 0 refills | Status: DC

## 2017-12-10 NOTE — Telephone Encounter (Signed)
-----   Message from Jac Canavanavid S Tysinger, PA-C sent at 12/10/2017 10:45 AM EDT ----- His ultrasound was normal.  Given his recent labs which included elevated alkaline phosphatase and liver test, let us have him begin vitamin D 1000 units daily over-the-counter.  Limit or avoid alcohol, eat a healthy low-fat diet and avoid fatty foods, avoid taking lots of Tylenol or ibuprofen regularly, not sure if he was doing this?  Let us plan to recheck hepatic function panel, iron level in 3 months

## 2017-12-10 NOTE — Telephone Encounter (Signed)
Called patient, advised of lab work. Patient stated that he would get the over the counter vitamin D. Also made lab appointment in 3 months.  Patient states that he did talk to Neurologist and they were unable to get him in until July 31st, he states that he is almost out of the medication you gave him, and he states that his feet are still bothering him.

## 2017-12-15 LAB — HEPATITIS PANEL, ACUTE
HEP B S AG: NEGATIVE
Hep A IgM: NEGATIVE
Hep B C IgM: NEGATIVE
Hep C Virus Ab: <0.1 s/co ratio (ref 0.0–0.9)

## 2017-12-15 LAB — SPECIMEN STATUS REPORT

## 2018-01-06 ENCOUNTER — Telehealth: Payer: Self-pay | Admitting: Medical

## 2018-01-06 NOTE — Telephone Encounter (Signed)
Pt is scheduled for [employer] in May. He is asking for a letter excusing him. He states he is in too much pain to sit as required. He does have a appt with neuro but can't get in until July. Pt is aware that Vincenza HewsShane is out of town and will not be back until week after next. Please advise on home or cell phone and if possible provide letter.

## 2018-01-16 NOTE — Telephone Encounter (Signed)
pls write a jury duty letter asking he be excused due to medical condition limiting prolonged sitting.

## 2018-01-17 ENCOUNTER — Encounter: Payer: Self-pay | Admitting: Medical

## 2018-01-17 NOTE — Telephone Encounter (Signed)
Advise pt that note is at front desk

## 2018-01-26 ENCOUNTER — Encounter: Payer: Self-pay | Admitting: Medical

## 2018-02-28 ENCOUNTER — Other Ambulatory Visit: Payer: Self-pay

## 2018-02-28 ENCOUNTER — Encounter: Payer: Self-pay | Admitting: Neurology

## 2018-02-28 ENCOUNTER — Ambulatory Visit: Payer: BLUE CROSS/BLUE SHIELD | Admitting: Neurology

## 2018-02-28 VITALS — BP 140/84 | HR 72 | Ht 73.0 in | Wt 207.4 lb

## 2018-02-28 DIAGNOSIS — M4306 Spondylolysis, lumbar region: Secondary | ICD-10-CM | POA: Insufficient documentation

## 2018-02-28 DIAGNOSIS — M4307 Spondylolysis, lumbosacral region: Secondary | ICD-10-CM | POA: Diagnosis not present

## 2018-02-28 DIAGNOSIS — R202 Paresthesia of skin: Secondary | ICD-10-CM | POA: Diagnosis not present

## 2018-02-28 MED ORDER — CYCLOBENZAPRINE HCL 5 MG PO TABS: 5.0000 mg | ORAL_TABLET | Freq: Every evening | ORAL | 5 refills | Status: DC | PRN

## 2018-02-28 MED ORDER — GABAPENTIN 100 MG PO CAPS: ORAL_CAPSULE | ORAL | 5 refills | Status: DC

## 2018-02-28 NOTE — Progress Notes (Signed)
[facility] Neurology Division Clinic Note - Initial Visit   Date: 02/28/18  Joshua Torres MRN: [id number] DOB: 1961/05/08   Dear Crosby Oyster, PA-C:  Thank you for your kind referral of Joshua Torres for consultation of bilateral feet pain. Although his history is well known to you, please allow [name] to reiterate it for the purpose of our medical record. The patient was accompanied to the clinic by self.   History of Present Illness: Joshua Torres is a 57 y.o. right-handed African American male presenting for evaluation of bilateral feet pain.    He was getting doxycycline for prostatitis in March and after three weeks of antibiotics treatment, he began noticing burning sensation over the dorsum of the feet and over the past few weeks, it has involved the soles of the feet.  Symptoms can vary in duration and location, but tends to involve the right foot more.  He has noticed exercise improves the discomfort, and it is worse with prolonged sitting and walking, which can involve his thighs also. He denies weakness or imbalance.  He has long history of chronic low back pain and was treated with ESI 20+ years ago.  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  He is being treated with prostatitis and having pelvic therapy about once per month.   Out-side paper records, electronic medical record, and images have been reviewed where available and summarized as:  Lab Results  Component Value Date   TSH 3.810 11/29/2017   Lab Results  Component Value Date   VITAMINB12 546 11/29/2017   Lab Results  Component Value Date   CREATININE 0.90 11/29/2017   BUN 13 11/29/2017   NA 143 11/29/2017   K 3.7 11/29/2017   CL 104 11/29/2017   CO2 24 11/29/2017    Past Medical History:  Diagnosis Date  . GERD (gastroesophageal reflux disease)   . Osteoarthritis    left middle finger  . Prostatitis   . Wears glasses     Past Surgical History:  Procedure Laterality Date  .  COLONOSCOPY  2011   Dr. Loreta Ave, q5 years, so due as of 2016  . NO PAST SURGERIES  04/2015     Medications:  Outpatient Encounter Medications as of 02/28/2018  Medication Sig  . cyclobenzaprine (FLEXERIL) 5 MG tablet Take 1 tablet (5 mg total) by mouth at bedtime as needed for muscle spasms.  Marland Kitchen gabapentin (NEURONTIN) 100 MG capsule Take 1 tablet at bedtime x 3 days, then increase to 1 tab twice daily, if you are tolerating, you can increase to 2 tablets twice daily.  . [DISCONTINUED] cholecalciferol (VITAMIN D) 1000 units tablet Take 1 tablet (1,000 Units total) by mouth daily.  . [DISCONTINUED] doxycycline (VIBRA-TABS) 100 MG tablet Take 1 tablet (100 mg total) by mouth 2 (two) times daily.  . [DISCONTINUED] finasteride (PROSCAR) 5 MG tablet Take 1 tablet (5 mg total) by mouth daily. (Patient not taking: Reported on 11/29/2017)  . [DISCONTINUED] hydrocortisone (ANUSOL-HC) 2.5 % rectal cream Place 1 application rectally 2 (two) times daily. (Patient not taking: Reported on 11/29/2017)  . [DISCONTINUED] hydrocortisone (ANUSOL-HC) 25 MG suppository Place 1 suppository (25 mg total) rectally 2 (two) times daily. (Patient not taking: Reported on 11/29/2017)  . [DISCONTINUED] pramoxine (PROCTOFOAM) 1 % foam Place 1 application rectally 3 (three) times daily as needed for itching. (Patient not taking: Reported on 11/29/2017)   No facility-administered encounter medications on file as of 02/28/2018.      Allergies:  Allergies  Allergen Reactions  . Ciprofloxacin     Achilles tendon ache, finger aches    Family History: Family History  Problem Relation Age of Onset  . Cancer Father        brain  . Stroke Father   . Diabetes Sister   . Diabetes Mother   . Other Brother        death related to a fall, head contusion  . Asthma Sister   . Heart disease Neg Hx     Social History: Social History   Tobacco Use  . Smoking status: Never Smoker  . Smokeless tobacco: Never Used  Substance Use  Topics  . Alcohol use: No    Alcohol/week: 0.0 oz  . Drug use: No   Social History   Social History Narrative   Lives with wife in a 2 story home.  Has 2 children. Truck driver for [employer].   Exercise - stretching, walking.  Attends church.  Education: college.     Review of Systems:  CONSTITUTIONAL: No fevers, chills, night sweats, or weight loss.   EYES: No visual changes or eye pain ENT: No hearing changes.  No history of nose bleeds.   RESPIRATORY: No cough, wheezing and shortness of breath.   CARDIOVASCULAR: Negative for chest pain, and palpitations.   GI: Negative for abdominal discomfort, blood in stools or black stools.  No recent change in bowel habits.   GU:  No history of incontinence.   MUSCLOSKELETAL: No history of joint pain or swelling.  No myalgias.   SKIN: Negative for lesions, rash, and itching.   HEMATOLOGY/ONCOLOGY: Negative for prolonged bleeding, bruising easily, and swollen nodes.  No history of cancer.   ENDOCRINE: Negative for cold or heat intolerance, polydipsia or goiter.   PSYCH:  No depression or anxiety symptoms.   NEURO: As Above.   Vital Signs:  BP 140/84   Pulse 72   Ht 6\' 1"  (1.854 m)   Wt 207 lb 6 oz (94.1 kg)   SpO2 98%   BMI 27.36 kg/m    General Medical Exam:   General:  Well appearing, comfortable.   Eyes/ENT: see cranial nerve examination.   Neck: No masses appreciated.  Full range of motion without tenderness.  No carotid bruits. Respiratory:  Clear to auscultation, good air entry bilaterally.   Cardiac:  Regular rate and rhythm, no murmur.   Extremities:  No deformities, edema, or skin discoloration.  Skin:  No rashes or lesions.  Neurological Exam: MENTAL STATUS including orientation to time, place, person, recent and remote memory, attention span and concentration, language, and fund of knowledge is normal.  Speech is not dysarthric.  CRANIAL NERVES: II:  No visual field defects.  Unremarkable fundi.   III-IV-VI: Pupils equal  round and reactive to light.  Normal conjugate, extra-ocular eye movements in all directions of gaze.  No nystagmus.  No ptosis.   V:  Normal facial sensation   VII:  Normal facial symmetry and movements.  VIII:  Normal hearing and vestibular function.   IX-X:  Normal palatal movement.   XI:  Normal shoulder shrug and head rotation.   XII:  Normal tongue strength and range of motion, no deviation or fasciculation.  MOTOR:  No atrophy, fasciculations or abnormal movements.  No pronator drift.  Tone is normal.    Right Upper Extremity:    Left Upper Extremity:    Deltoid  5/5   Deltoid  5/5   Biceps  5/5  Biceps  5/5   Triceps  5/5   Triceps  5/5   Wrist extensors  5/5   Wrist extensors  5/5   Wrist flexors  5/5   Wrist flexors  5/5   Finger extensors  5/5   Finger extensors  5/5   Finger flexors  5/5   Finger flexors  5/5   Dorsal interossei  5/5   Dorsal interossei  5/5   Abductor pollicis  5/5   Abductor pollicis  5/5   Tone (Ashworth scale)  0  Tone (Ashworth scale)  0   Right Lower Extremity:    Left Lower Extremity:    Hip flexors  5/5   Hip flexors  5/5   Hip extensors  5/5   Hip extensors  5/5   Knee flexors  5/5   Knee flexors  5/5   Knee extensors  5/5   Knee extensors  5/5   Dorsiflexors  5/5   Dorsiflexors  5/5   Plantarflexors  5/5   Plantarflexors  5/5   Toe extensors  5/5   Toe extensors  5/5   Toe flexors  4+/5   Toe flexors  4+/5   Tone (Ashworth scale)  0  Tone (Ashworth scale)  0   MSRs:  Right                                                                 Left brachioradialis 2+  brachioradialis 2+  biceps 2+  biceps 2+  triceps 2+  triceps 2+  patellar 3+  patellar 3+  ankle jerk 2+  ankle jerk 2+  Hoffman no  Hoffman no  plantar response down  plantar response down   SENSORY:  Vibration is reduced at the right ankle and great toe, intact on the left.  Pin prick, temperature, and light touch is intact throughout.  Rhomberg's sign absent.    COORDINATION/GAIT: Normal finger-to- nose-finger.  Intact rapid alternating movements bilaterally.  Gait narrow based and stable. Tandem and stressed gait intact.    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  Unlikely to represent neuropathy as symptoms are asymmetrical and reflexes are brisker than expected in the legs/feet.   - Start physical therapy for low back stretching and strengthening  - For burning pain, start gabapentin 100mg  - Take 1 tablet at bedtime x 3 days, then increase to 1 tab twice daily, if tolerating, can increase to 2 tablets twice daily.  - For low back pain, start flexeril 5mg  at bedtime as needed  - Call with update in 6 weeks, if no improvement proceed with MRI lumbar spine  Return to clinic in 3 months    Thank you for allowing me to participate in patient's care.  If I can answer any additional questions, I would be pleased to do so.    Sincerely,    Donika K. Allena Katz, DO

## 2018-02-28 NOTE — Patient Instructions (Signed)
For burning pain, start gabapentin $RemoveBefor [id number]$100mge to 1 tab twice daily, if you are tolerating, you can increase to 2 tablets twice daily.  For low back pain, start flexeril 5mg  at bedtime as needed  Start physical therapy  Call with an update in 6 weeks  Return to clinic in 3 months

## 2018-03-07 ENCOUNTER — Ambulatory Visit: Payer: BLUE CROSS/BLUE SHIELD | Admitting: Medical

## 2018-03-07 ENCOUNTER — Encounter: Payer: Self-pay | Admitting: Medical

## 2018-03-07 VITALS — BP 126/88 | HR 64 | Resp 16 | Ht 73.0 in | Wt 206.6 lb

## 2018-03-07 DIAGNOSIS — R102 Pelvic and perineal pain: Secondary | ICD-10-CM

## 2018-03-07 DIAGNOSIS — R945 Abnormal results of liver function studies: Secondary | ICD-10-CM

## 2018-03-07 DIAGNOSIS — R030 Elevated blood-pressure reading, without diagnosis of hypertension: Secondary | ICD-10-CM | POA: Diagnosis not present

## 2018-03-07 DIAGNOSIS — R748 Abnormal levels of other serum enzymes: Secondary | ICD-10-CM

## 2018-03-07 DIAGNOSIS — R7989 Other specified abnormal findings of blood chemistry: Secondary | ICD-10-CM

## 2018-03-07 DIAGNOSIS — N411 Chronic prostatitis: Secondary | ICD-10-CM

## 2018-03-07 LAB — POCT URINALYSIS DIP (PROADVANTAGE DEVICE)
BILIRUBIN UA: NEGATIVE
BILIRUBIN UA: NEGATIVE mg/dL
Blood, UA: NEGATIVE
Glucose, UA: NEGATIVE mg/dL
Leukocytes, UA: NEGATIVE
Nitrite, UA: NEGATIVE
PH UA: 6 (ref 5.0–8.0)
Protein Ur, POC: NEGATIVE mg/dL
SPECIFIC GRAVITY, URINE: 1.015
Urobilinogen, Ur: NEGATIVE

## 2018-03-07 NOTE — Progress Notes (Signed)
Subjective: Chief Complaint  Patient presents with  . Follow-up    1 month follow up    Here for f/u.     Thinks he has another prostate infection going on.  Had sex this weekend, and starting to feel symptoms he has gotten like before with prostatitis.   He has a script of Doxycycline from prior the didn't use.  He just started using this.   Married.   No concern for STD.  Wife has liver cancer, and she is on chemotherapy.  He reports tingling in penis, discomfort in prostate/rectal area.   Sitting is uncomfortable.  No burning with urination, no urinary frequency, no blood in urine.  This is the first time he had sex in months.   No penile discharge.    Saw neurology recently about his leg numbness, was put on Gabapentin and flexeril.    Saw urology 02/2017, Dr. Patsi Sears, seem to have issues with pelvic floor discomfort and recurrent prostatitis.   Was referred to pelvic floor PT.  Sees PT once monthly currently for this ongoing pelvic floor issue.    Regarding elevated BP, no chest pain, no dyspnea, no SOB, no palpitations.   No prior therapy for HTN.    Past Medical History:  Diagnosis Date  . GERD (gastroesophageal reflux disease)   . Osteoarthritis    left middle finger  . Prostatitis   . Wears glasses    Current Outpatient Medications on File Prior to Visit  Medication Sig Dispense Refill  . cyclobenzaprine (FLEXERIL) 5 MG tablet Take 1 tablet (5 mg total) by mouth at bedtime as needed for muscle spasms. 30 tablet 5  . doxycycline (VIBRA-TABS) 100 MG tablet Take 100 mg by mouth 2 (two) times daily.    Marland Kitchen gabapentin (NEURONTIN) 100 MG capsule Take 1 tablet at bedtime x 3 days, then increase to 1 tab twice daily, if you are tolerating, you can increase to 2 tablets twice daily. 90 capsule 5   No current facility-administered medications on file prior to visit.    ROS as in subjective    Objective: BP 130/88   Pulse 64   Resp 16   Ht 6\' 1"  (1.854 m)   Wt 206 lb 9.6 oz  (93.7 kg)   SpO2 98%   BMI 27.26 kg/m   Wt Readings from Last 3 Encounters:  03/07/18 206 lb 9.6 oz (93.7 kg)  02/28/18 207 lb 6 oz (94.1 kg)  11/29/17 209 lb 12.8 oz (95.2 kg)   BP Readings from Last 3 Encounters:  03/07/18 130/88  02/28/18 140/84  11/29/17 126/78     General appearance: alert, no distress, WD/WN,  Neck: supple, no lymphadenopathy, no thyromegaly, no masses Heart: RRR, normal S1, S2, no murmurs Lungs: CTA bilaterally, no wheezes, rhonchi, or rales Abdomen: +bs, soft, non tender, non distended, no masses, no hepatomegaly, no splenomegaly Extremities: no edema, no cyanosis, no clubbing Pulses: 2+ symmetric, upper and lower extremities, normal cap refill Neurological: alert, oriented x 3, CN2-12 intact, strength normal upper extremities and lower extremities, sensation normal throughout, DTRs 2+ throughout, no cerebellar signs, gait normal Psychiatric: normal affect, behavior normal, pleasant  GU/DRE - deferred   Assessment: Encounter Diagnoses  Name Primary?  . Elevated alkaline phosphatase level Yes  . Elevated LFTs   . Chronic prostatitis   . Pelvic pain   . Elevated blood-pressure reading without diagnosis of hypertension        Plan: I reviewed labs from 11/2017, CBC normal, negative  hepatitis screen, elevated ALP and ALT, normal B12 and TSH.  Recheck labs today regarding elevated ALP and ALT last viist.  Liver ultrasound was normal 11/2017  PSA level today  Given ongoing pelvic flood dysfunction and chronic prostatitis vs urinary tract issues, advised he f/u with urology.  He can begin the course of Doxycycline he has at home.      Elevated BP - recheck was normal.   Advised [facility] and get me readings in 2 weeks.  Derryl HarborLonnie was seen today for follow-up.  Diagnoses and all orders for this visit:  Elevated alkaline phosphatase level -     Hepatic function panel -     VITAMIN D 25 Hydroxy (Vit-D Deficiency,  Fractures)  Elevated LFTs -     VITAMIN D 25 Hydroxy (Vit-D Deficiency, Fractures)  Chronic prostatitis -     PSA -     POCT Urinalysis DIP (Proadvantage Device)  Pelvic pain  Elevated blood-pressure reading without diagnosis of hypertension   F/u pending labs

## 2018-03-08 ENCOUNTER — Other Ambulatory Visit: Payer: Self-pay | Admitting: Medical

## 2018-03-08 LAB — VITAMIN D 25 HYDROXY (VIT D DEFICIENCY, FRACTURES): VIT D 25 HYDROXY: 28.5 ng/mL — AB (ref 30.0–100.0)

## 2018-03-08 LAB — HEPATIC FUNCTION PANEL
ALK PHOS: 102 IU/L (ref 39–117)
ALT: 32 IU/L (ref 0–44)
AST: 26 IU/L (ref 0–40)
Albumin: 4.1 g/dL (ref 3.5–5.5)
Bilirubin Total: 1.4 mg/dL — ABNORMAL HIGH (ref 0.0–1.2)
Bilirubin, Direct: 0.24 mg/dL (ref 0.00–0.40)
TOTAL PROTEIN: 6.9 g/dL (ref 6.0–8.5)

## 2018-03-08 LAB — PSA: PROSTATE SPECIFIC AG, SERUM: 0.8 ng/mL (ref 0.0–4.0)

## 2018-03-08 MED ORDER — VITAMIN D 1000 UNITS PO TABS: 1000.0000 [IU] | ORAL_TABLET | Freq: Every day | ORAL | 3 refills | Status: DC

## 2018-04-08 ENCOUNTER — Ambulatory Visit: Payer: Self-pay | Admitting: Neurology

## 2018-04-08 ENCOUNTER — Encounter

## 2018-04-25 ENCOUNTER — Telehealth: Payer: Self-pay | Admitting: Medical

## 2018-04-25 NOTE — Telephone Encounter (Signed)
Error

## 2018-06-01 ENCOUNTER — Encounter: Payer: Self-pay | Admitting: Neurology

## 2018-06-01 ENCOUNTER — Ambulatory Visit (INDEPENDENT_AMBULATORY_CARE_PROVIDER_SITE_OTHER): Payer: BLUE CROSS/BLUE SHIELD | Admitting: Neurology

## 2018-06-01 VITALS — BP 140/80 | HR 90 | Ht 73.0 in | Wt 204.0 lb

## 2018-06-01 DIAGNOSIS — M4307 Spondylolysis, lumbosacral region: Secondary | ICD-10-CM

## 2018-06-01 DIAGNOSIS — R202 Paresthesia of skin: Secondary | ICD-10-CM

## 2018-06-01 NOTE — Progress Notes (Signed)
Follow-up Visit   Date: 06/01/18    Joshua Torres MRN: [id number] DOB: [birth year]   Interim History: Joshua Torres is a 57 y.o. right-handed African American male returning to the clinic for follow-up of bilateral feet paresthesias.  The patient was accompanied to the clinic by self.  History of present illness: He was getting doxycycline for prostatitis in March and after three weeks of antibiotics treatment, he began noticing burning sensation over the dorsum of the feet and over the past few weeks, it has involved the soles of the feet.  Symptoms can vary in duration and location, but tends to involve the right foot more.  He has noticed exercise improves the discomfort, and it is worse with prolonged sitting and walking, which can involve his thighs also. He denies weakness or imbalance.  He has long history of chronic low back pain and was treated with ESI 20+ years ago.  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  He is being treated with prostatitis and having pelvic therapy about once per month.   UPDATE 06/01/2018:  He is here for 6 month follow-up visit.  There has been significant changes in his personal life.  He wife of 31 years passed away very recently on 05/29/23 from liver cancer and he is grieving her loss.  He does reports having good social support. With respect to his feet paresthesias, he had marked response to PT and when he was regularly attending PT, pain was minimal.  He reduced the frequency of PT sessions after his wife became ill and during this time, he noticed that burning pain was slowly returning in the feet, worse on the right, but overall, it is still about 50% reduction in pain compared to before.  He denies any weakness.  Low back pain is minimal.      Medications:  Current Outpatient Medications on File Prior to Visit  Medication Sig Dispense Refill  . cholecalciferol (VITAMIN D) 1000 units tablet Take 1 tablet (1,000 Units total) by mouth  daily. 90 tablet 3  . cyclobenzaprine (FLEXERIL) 5 MG tablet Take 1 tablet (5 mg total) by mouth at bedtime as needed for muscle spasms. 30 tablet 5  . gabapentin (NEURONTIN) 100 MG capsule Take 1 tablet at bedtime x 3 days, then increase to 1 tab twice daily, if you are tolerating, you can increase to 2 tablets twice daily. (Patient taking differently: 100 mg at bedtime. Take 1 tablet at bedtime x 3 days, then increase to 1 tab twice daily, if you are tolerating, you can increase to 2 tablets twice daily.) 90 capsule 5   No current facility-administered medications on file prior to visit.     Allergies:  Allergies  Allergen Reactions  . Ciprofloxacin     Achilles tendon ache, finger aches    Review of Systems:  CONSTITUTIONAL: No fevers, chills, night sweats, or weight loss.  EYES: No visual changes or eye pain ENT: No hearing changes.  No history of nose bleeds.   RESPIRATORY: No cough, wheezing and shortness of breath.   CARDIOVASCULAR: Negative for chest pain, and palpitations.   GI: Negative for abdominal discomfort, blood in stools or black stools.  No recent change in bowel habits.   GU:  No history of incontinence.   MUSCLOSKELETAL: No history of joint pain or swelling.  No myalgias.   SKIN: Negative for lesions, rash, and itching.   ENDOCRINE: Negative for cold or heat intolerance, polydipsia or  goiter.   PSYCH:  + depressed or anxiety symptoms.   NEURO: As Above.   Vital Signs:  BP 140/80   Pulse 90   Ht 6\' 1"  (1.854 m)   Wt 204 lb (92.5 kg)   SpO2 98%   BMI 26.91 kg/m   General Medical Exam:   General:  Well appearing, comfortable  Eyes/ENT: see cranial nerve examination.   Neck: No masses appreciated.  Full range of motion without tenderness.  No carotid bruits. Respiratory:  Clear to auscultation, good air entry bilaterally.   Cardiac:  Regular rate and rhythm, no murmur.   Ext:  No edema  Neurological Exam: MENTAL STATUS including orientation to time,  place, person, recent and remote memory, attention span and concentration, language, and fund of knowledge is normal.  Speech is not dysarthric.  CRANIAL NERVES: No visual field defects.  Pupils equal round and reactive to light.  Normal conjugate, extra-ocular eye movements in all directions of gaze.  No ptosis. Normal facial sensation.  Face is symmetric. Palate elevates symmetrically.  Tongue is midline.  MOTOR:  Motor strength is 5/5 in all extremities.  No pronator drift.  Tone is normal.    MSRs:  Right                                                                 Left brachioradialis 2+  brachioradialis 2+  biceps 2+  biceps 2+  triceps 2+  triceps 2+  patellar 3+  patellar 3+  ankle jerk 3+  ankle jerk 2+   SENSORY:  Intact to vibration throughout.  COORDINATION/GAIT:  Normal finger-to- nose-finger and heel-to-shin.  Intact rapid alternating movements bilaterally.  Gait narrow based and stable.   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 Symptoms are responsive to physical therapy and since reducing the frequency, pain has returned.  I will optimize his gabapentin and increase slowly to 200mg  BID.  He can continue to use flexeril 5mg  as needed for low back pain.  Understandably, he has a lot going on with the recent loss of his wife.  My condolences were given.  When he ready, he will restart physical therapy which has provided the most significant relief.  Going forward, we discussed getting MRI lumbar spine if there is no improvement.  Call with update in 2 months to determine further titration of gabapentin.  Return to clinic in 6 months   Thank you for allowing me to participate in patient's care.  If I can answer any additional questions, I would be pleased to do so.    Sincerely,    Donika K. Allena Katz, DO

## 2018-06-01 NOTE — Patient Instructions (Addendum)
Thank you for coming to see [name] today.  We are thinking of you and your family.  For your feet pain, please increase gabapentin to 100mg  twice daily for one week, then increase to 2 tablets twice daily.  Make medication changes on your day off so you know whether it makes you too sleepy or not.   Call with an update in 2 months, if you are tolerating the medication we can go higher on gabapentin to 300mg  twice daily.  When you are ready, restart physical therapy.  Return to clinic in 6 months

## 2018-09-12 ENCOUNTER — Encounter: Payer: BLUE CROSS/BLUE SHIELD | Admitting: Medical

## 2018-09-26 ENCOUNTER — Encounter: Payer: Self-pay | Admitting: Medical

## 2018-11-17 LAB — PSA: PSA: 0.51

## 2018-11-22 ENCOUNTER — Encounter: Payer: Self-pay | Admitting: Medical

## 2018-12-05 ENCOUNTER — Encounter: Payer: Self-pay | Admitting: Neurology

## 2018-12-05 ENCOUNTER — Other Ambulatory Visit: Payer: Self-pay

## 2018-12-05 ENCOUNTER — Ambulatory Visit: Payer: BLUE CROSS/BLUE SHIELD | Admitting: Neurology

## 2018-12-05 VITALS — BP 100/70 | HR 76 | Ht 73.0 in | Wt 212.2 lb

## 2018-12-05 DIAGNOSIS — M4307 Spondylolysis, lumbosacral region: Secondary | ICD-10-CM

## 2018-12-05 MED ORDER — GABAPENTIN 100 MG PO CAPS: 200.0000 mg | ORAL_CAPSULE | Freq: Two times a day (BID) | ORAL | 3 refills | Status: DC

## 2018-12-05 NOTE — Progress Notes (Signed)
Follow-up Visit   Date: 12/05/18    Joshua Torres MRN: [id number] DOB: October 26, 1960   Interim History: Joshua Torres is a 58 y.o. right-handed African American male returning to the clinic for follow-up of bilateral feet paresthesias.  The patient was accompanied to the clinic by self.  History of present illness: He was getting doxycycline for prostatitis in March and after three weeks of antibiotics treatment, he began noticing burning sensation over the dorsum of the feet and over the past few weeks, it has involved the soles of the feet.  Symptoms can vary in duration and location, but tends to involve the right foot more.  He has noticed exercise improves the discomfort, and it is worse with prolonged sitting and walking, which can involve his thighs also. He denies weakness or imbalance.  He has long history of chronic low back pain and was treated with ESI 20+ years ago.  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  He is being treated with prostatitis and having pelvic therapy about once per month.   UPDATE 06/01/2018:  There has been significant changes in his personal life.  He wife of 31 years passed away very recently on 2023/06/13 from liver cancer and he is grieving her loss.  He does reports having good social support. With respect to his feet paresthesias, he had marked response to PT and when he was regularly attending PT, pain was minimal.  He reduced the frequency of PT sessions after his wife became ill and during this time, he noticed that burning pain was slowly returning in the feet, worse on the right, but overall, it is still about 50% reduction in pain compared to before.    UPDATE 12/05/2018:  He is here for 6 month follow-up visit.  His leg paresthesias and pain are improved with physical therapy exercises and gabapentin 200mg  BID.  He is pleased with the relief he has and does not want to increase the dose.  He denies low back pain, cramps.    Medications:   Current Outpatient Medications on File Prior to Visit  Medication Sig Dispense Refill  . cholecalciferol (VITAMIN D) 1000 units tablet Take 1 tablet (1,000 Units total) by mouth daily. 90 tablet 3  . Cyanocobalamin (VITAMIN B12 PO) Take by mouth.     No current facility-administered medications on file prior to visit.     Allergies:  Allergies  Allergen Reactions  . Ciprofloxacin     Achilles tendon ache, finger aches    Review of Systems:  CONSTITUTIONAL: No fevers, chills, night sweats, or weight loss.  EYES: No visual changes or eye pain ENT: No hearing changes.  No history of nose bleeds.   RESPIRATORY: No cough, wheezing and shortness of breath.   CARDIOVASCULAR: Negative for chest pain, and palpitations.   GI: Negative for abdominal discomfort, blood in stools or black stools.  No recent change in bowel habits.   GU:  No history of incontinence.   MUSCLOSKELETAL: No history of joint pain or swelling.  No myalgias.   SKIN: Negative for lesions, rash, and itching.   ENDOCRINE: Negative for cold or heat intolerance, polydipsia or goiter.   PSYCH:  + depressed or anxiety symptoms.   NEURO: As Above.   Vital Signs:  BP 100/70   Pulse 76   Ht 6\' 1"  (1.854 m)   Wt 212 lb 4 oz (96.3 kg)   SpO2 98%   BMI 28.00 kg/m  Neurological Exam: MENTAL STATUS including orientation to time, place, person, recent and remote memory, attention span and concentration, language, and fund of knowledge is normal.  Speech is not dysarthric.  CRANIAL NERVES: No visual field defects.  Pupils equal round and reactive to light.  Normal conjugate, extra-ocular eye movements in all directions of gaze.  No ptosis. Normal facial sensation.  Face is symmetric. Palate elevates symmetrically.  Tongue is midline.  MOTOR:  Motor strength is 5/5 in all extremities.  No pronator drift.  Tone is normal.    MSRs:  Right                                                                 Left brachioradialis  2+  brachioradialis 2+  biceps 2+  biceps 2+  triceps 2+  triceps 2+  patellar 3+  patellar 3+  ankle jerk 3+  ankle jerk 2+   SENSORY:  Intact to vibration throughout.  COORDINATION/GAIT:  Normal finger-to- nose-finger and heel-to-shin.  Intact rapid alternating movements bilaterally.  Gait narrow based and stable.   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  - Continue gabapentin 200mg  twice daily  - Continue home exercises  - MRI lumbar spine, if symptoms get worse  Return to clinic 6 months  Thank you for allowing me to participate in patient's care.  If I can answer any additional questions, I would be pleased to do so.    Sincerely,    Hanalei Glace K. Allena Katz, DO

## 2018-12-05 NOTE — Patient Instructions (Signed)
Keep up with your exercises  Continue gabapentin 200mg  twice daily  Return to clinic in 6 months

## 2019-02-02 ENCOUNTER — Ambulatory Visit (INDEPENDENT_AMBULATORY_CARE_PROVIDER_SITE_OTHER): Payer: BLUE CROSS/BLUE SHIELD | Admitting: Medical

## 2019-02-02 ENCOUNTER — Other Ambulatory Visit: Payer: Self-pay

## 2019-02-02 ENCOUNTER — Encounter: Payer: Self-pay | Admitting: Medical

## 2019-02-02 VITALS — Temp 96.5°F | Ht 73.0 in | Wt 208.0 lb

## 2019-02-02 DIAGNOSIS — H9201 Otalgia, right ear: Secondary | ICD-10-CM | POA: Diagnosis not present

## 2019-02-02 DIAGNOSIS — J0110 Acute frontal sinusitis, unspecified: Secondary | ICD-10-CM | POA: Diagnosis not present

## 2019-02-02 MED ORDER — AMOXICILLIN 875 MG PO TABS: 875.0000 mg | ORAL_TABLET | Freq: Two times a day (BID) | ORAL | 0 refills | Status: DC

## 2019-02-02 NOTE — Progress Notes (Signed)
Subjective:     Patient ID: Joshua Torres, male   DOB: [birth year], 58 y.o.   MRN: [id number]  This visit type was conducted due to national recommendations for restrictions regarding the COVID-19 Pandemic (e.g. social distancing) in an effort to limit this patient's exposure and mitigate transmission in our community.  Due to their co-morbid illnesses, this patient is at least at moderate risk for complications without adequate follow up.  This format is felt to be most appropriate for this patient at this time.    Documentation for virtual audio and video telecommunications through Zoom encounter:  The patient was located at home. The provider was located in the office. The patient did consent to this visit and is aware of possible charges through their insurance for this visit.  The other persons participating in this telemedicine service were none. Time spent on call was 15 minutes and in review of previous records >18 minutes total.  This virtual service is not related to other E/M service within previous 7 days.   HPI Chief Complaint  Patient presents with  . headache    head congestions, ear ache right side, headache X 9month    Virtual visit today for possible sinus or ear infection.  For the past 1.5 months awakening congested, getting headaches.  Has some ear pain in right ear x 2 weeks.   Has some sinus pressure, center of head.   No sore throat.  No drainage in throat.   Is stuffy.   No cough.  No NVD, no body aches or chills.  No SOB.  Using OTC medication with some relief, Claritin D.  Occasional sneeze.  No itchy or watery eyes.  No fever.  96.5 temp this morning.  No sick contacts.  No exposure to coronavirus.  He had a coworker that passed recently of Covid19 but he wasn't around them.  No other aggravating or relieving factors. No other complaint.  Past Medical History:  Diagnosis Date  . GERD (gastroesophageal reflux disease)   . Osteoarthritis    left middle finger   . Prostatitis   . Wears glasses    Current Outpatient Medications on File Prior to Visit  Medication Sig Dispense Refill  . cholecalciferol (VITAMIN D) 1000 units tablet Take 1 tablet (1,000 Units total) by mouth daily. 90 tablet 3  . Cyanocobalamin (VITAMIN B12 PO) Take by mouth.    . gabapentin (NEURONTIN) 100 MG capsule Take 2 capsules (200 mg total) by mouth 2 (two) times daily. 360 capsule 3   No current facility-administered medications on file prior to visit.     Review of Systems As in subjective    Objective:   Physical Exam  Temp (!) 96.5 F (35.8 C) (Oral)   Ht 6\' 1"  (1.854 m)   Wt 208 lb (94.3 kg)   BMI 27.44 kg/m   Due to coronavirus pandemic stay at home measures, patient visit was virtual and they were not examined in person.   Gen: wd, wn, nad      Assessment:     Encounter Diagnoses  Name Primary?  . Acute non-recurrent frontal sinusitis Yes  . Ear pain, right        Plan:      Discussed symptoms, concerns.  Begin amoxicillin, rest, hydrate well, can use OTC Allegra or can try Mucinex.  If not completely resolved within 10 days, then call back or recheck.   Joshua Torres was seen today for headache.  Diagnoses and all orders for this  visit:  Acute non-recurrent frontal sinusitis  Ear pain, right  Other orders -     amoxicillin (AMOXIL) 875 MG tablet; Take 1 tablet (875 mg total) by mouth 2 (two) times daily.

## 2019-02-16 ENCOUNTER — Telehealth: Payer: Self-pay | Admitting: Medical

## 2019-02-16 ENCOUNTER — Other Ambulatory Visit: Payer: Self-pay | Admitting: Medical

## 2019-02-16 MED ORDER — AMOXICILLIN-POT CLAVULANATE 875-125 MG PO TABS: 1.0000 | ORAL_TABLET | Freq: Two times a day (BID) | ORAL | 0 refills | Status: AC

## 2019-02-16 NOTE — Telephone Encounter (Signed)
Pt called and states that medication he was given for sinus infection did help but he has not finished meds and symptoms are returning. Pt can be reached at [phone] and he uses [facility].

## 2019-02-16 NOTE — Telephone Encounter (Signed)
Pt informed

## 2019-02-16 NOTE — Telephone Encounter (Signed)
Lets add 5 days of Augmentin antibiotic, sent to pharmacy

## 2019-03-28 ENCOUNTER — Other Ambulatory Visit: Payer: Self-pay | Admitting: Medical

## 2019-04-20 ENCOUNTER — Other Ambulatory Visit: Payer: Self-pay

## 2019-04-20 ENCOUNTER — Ambulatory Visit: Payer: BC Managed Care – PPO | Admitting: Medical

## 2019-04-20 ENCOUNTER — Encounter: Payer: Self-pay | Admitting: Medical

## 2019-04-20 VITALS — Temp 96.4°F | Ht 73.0 in | Wt 208.0 lb

## 2019-04-20 DIAGNOSIS — H9203 Otalgia, bilateral: Secondary | ICD-10-CM

## 2019-04-20 DIAGNOSIS — R51 Headache: Secondary | ICD-10-CM

## 2019-04-20 DIAGNOSIS — Z20828 Contact with and (suspected) exposure to other viral communicable diseases: Secondary | ICD-10-CM | POA: Diagnosis not present

## 2019-04-20 DIAGNOSIS — R519 Headache, unspecified: Secondary | ICD-10-CM

## 2019-04-20 NOTE — Progress Notes (Signed)
Subjective:     Patient ID: Joshua Torres, male   DOB: 1960-10-01, 58 y.o.   MRN: [id number]  This visit type was conducted due to national recommendations for restrictions regarding the COVID-19 Pandemic (e.g. social distancing) in an effort to limit this patient's exposure and mitigate transmission in our community. This format is felt to be most appropriate for this patient at this time.    Documentation for virtual audio and video telecommunications through Zoom encounter:  The patient was located at home. The provider was located in the office. The patient did consent to this visit and is aware of possible charges through their insurance for this visit.  The other persons participating in this telemedicine service were none. Time spent on call was 15 minutes and in review of previous records >15 minutes total.  This virtual service is not related to other E/M service within previous 7 days.   HPI Chief Complaint  Patient presents with  . headache    headache, earache, congestion X 1 week,  co worker in hospital with [phone]    Virtual consult today regarding ear pain and congestion.  He notes having several week history of ear pressure.  The ear pressure has been intermittent in both ears with some head congestion, nostril stopped up.  It is been mild.  Is worse in the morning but better throughout the day.  He has had some mild headache.  He just recently got a nasal lavage and decongestant medication over-the-counter started using this within the last few days.  Feels better today than last few days.  Otherwise he denies any other symptoms.  He has an occasional cough to clear his throat but otherwise no cough, no shortness of breath, No nausea, no vomiting, no diarrhea, no change in sense or smell or taste, no SOB, no body aches or chills, no fever.    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  He learned a few days ago  that the truck he drives was driven by another driver 7 days ago that apparently was COVID positive.  This person was sick last week and just got out of the hospital yesterday. He drove the same truck Sunday 5 days ago, 2 days after the other person had driven the truck.  He was not aware at the time that the person had driven the truck.   He does note that he uses sanitizing wipes to wipe down doorknobs and all surfaces when he gets in a truck including that day he had wiped down the surfaces before entering the truck and before using the truck.  He does not feel sick otherwise.  He wants advice on what to do next  No other aggravating or relieving factors. No other complaint.  Review of Systems As in subjective    Objective:   Physical Exam Temp (!) 96.4 F (35.8 C) (Oral)   Ht 6\' 1"  (1.854 m)   Wt 208 lb (94.3 kg)   BMI 27.44 kg/m   Due to coronavirus pandemic stay at home measures, patient visit was virtual and they were not examined in person.   Gen: wd, wn, nad     Assessment:     Encounter Diagnoses  Name Primary?  . Otalgia of both ears Yes  . Exposure to SARS-associated coronavirus   . Nonintractable headache, unspecified chronicity pattern, unspecified headache type        Plan:  We discussed his concerns.  Currently his symptoms suggest eustachian tube dysfunction/inner ear pressure/sinus pressure.  He currently does not have characteristic COVID symptoms.  Advise he continue nasal decongestant, nasal lavage, good water intake for the next few days.  If not improving over the next few days then let me know we might consider antibiotic for sinus infection/eustachian tube dysfunction.    I advised that he has a change in symptoms that would suggest flulike illness or COVID symptoms as we discussed and call back immediately.  Furthermore if he has a change in symptoms suggesting COVID or flulike symptoms to be self quarantine as we discussed.  Out of abundance of  caution we discussed being more cautious in general right now, wearing a mask, limiting any exposure to the general public right now.  It is unclear as to how likely he would be to have been exposed given he was not directly around the COVID contact.  He also had disinfected surfaces before he used the same truck.   However there is still a small possibility that he could have come in contact with aerosols.  I recommended he go and have a Covid test today.  I put in the order.  I advised he remain vigilant to watch out for symptoms changing, I advised he wear his mask although he has limited interaction with anyone during his truck delivery route.  We discussed the means of transmission.  I also asked him to talk with his supervisors about how they want to handle this situation.  His supervisor has already taken the truck out of commission for cleaning prior to today.  Joshua Torres will let me know if he has any change in symptoms next few days otherwise he will practice self quarantine as much as he can within reason.  This is an unusual situation since he didn't have direct exposure and has no current symptoms that is frankly Covid related.    F/u pending Covid test.    Joshua Torres was seen today for headache.  Diagnoses and all orders for this visit:  Otalgia of both ears  Exposure to SARS-associated coronavirus -     Novel Coronavirus, NAA ([facility])  Nonintractable headache, unspecified chronicity pattern, unspecified headache type

## 2019-04-24 ENCOUNTER — Telehealth: Payer: Self-pay | Admitting: Medical

## 2019-04-24 ENCOUNTER — Other Ambulatory Visit: Payer: Self-pay | Admitting: Medical

## 2019-04-24 MED ORDER — AMOXICILLIN-POT CLAVULANATE 875-125 MG PO TABS: 1.0000 | ORAL_TABLET | Freq: Two times a day (BID) | ORAL | 0 refills | Status: AC

## 2019-04-24 NOTE — Telephone Encounter (Signed)
Pt called and said the amoxicillin that was called in he may need something else because if hes not mistaken he had an allergic reaction to it last time.

## 2019-04-24 NOTE — Progress Notes (Signed)
amxoi

## 2019-04-24 NOTE — Telephone Encounter (Signed)
A few months when he has sinus infection we ended up having to use Augmentin as he didn't initial respond to plain Amoxicillin but I don't see anything about allergic reaction.  Chart shows prior allergy to Cipro antibiotic.   Does he still feel like this is in error?

## 2019-04-25 NOTE — Telephone Encounter (Signed)
Left message on voicemail for patient to call back. 

## 2019-04-26 NOTE — Telephone Encounter (Signed)
Yes he has

## 2019-04-26 NOTE — Telephone Encounter (Signed)
Okay, I assume by now he started the Augmentin antibiotic as sent to the pharmacy a few days ago?  If not, then begin Augmentin for suspected ear infection

## 2019-04-26 NOTE — Telephone Encounter (Signed)
Patient states that he is only allergic reaction to Cipro.   He had gotten it mixed up.

## 2019-05-16 ENCOUNTER — Other Ambulatory Visit: Payer: Self-pay

## 2019-05-16 ENCOUNTER — Ambulatory Visit: Payer: BC Managed Care – PPO | Admitting: Medical

## 2019-05-16 ENCOUNTER — Encounter: Payer: Self-pay | Admitting: Medical

## 2019-05-16 VITALS — BP 120/76 | HR 68 | Temp 98.9°F | Ht 73.0 in | Wt 211.0 lb

## 2019-05-16 DIAGNOSIS — Z7189 Other specified counseling: Secondary | ICD-10-CM | POA: Insufficient documentation

## 2019-05-16 DIAGNOSIS — Z23 Encounter for immunization: Secondary | ICD-10-CM | POA: Diagnosis not present

## 2019-05-16 DIAGNOSIS — Z7185 Encounter for immunization safety counseling: Secondary | ICD-10-CM | POA: Insufficient documentation

## 2019-05-16 DIAGNOSIS — M6289 Other specified disorders of muscle: Secondary | ICD-10-CM

## 2019-05-16 DIAGNOSIS — M4306 Spondylolysis, lumbar region: Secondary | ICD-10-CM

## 2019-05-16 DIAGNOSIS — Z0000 Encounter for general adult medical examination without abnormal findings: Secondary | ICD-10-CM | POA: Diagnosis not present

## 2019-05-16 DIAGNOSIS — N411 Chronic prostatitis: Secondary | ICD-10-CM | POA: Diagnosis not present

## 2019-05-16 DIAGNOSIS — R7989 Other specified abnormal findings of blood chemistry: Secondary | ICD-10-CM

## 2019-05-16 DIAGNOSIS — H9209 Otalgia, unspecified ear: Secondary | ICD-10-CM

## 2019-05-16 NOTE — Progress Notes (Signed)
Subjective:   HPI  Joshua Torres is a 58 y.o. male who presents for Chief Complaint  Patient presents with  . fasting cpe    fasting cpe, slight ear ache    Patient Care Team: , Cleda Mccreedy as PCP - General (Family Medicine) Sees dentist Sees eye doctor Dr. Nita Sickle, neurology Dr. Charna Elizabeth, GI Dr. Rhoderick Moody, [facility]   Concerns: Ear still giving problems.  Had a few recent visits related to this.    Reviewed their medical, surgical, family, social, medication, and allergy history and updated chart as appropriate.  Past Medical History:  Diagnosis Date  . GERD (gastroesophageal reflux disease)   . Osteoarthritis    left middle finger  . Prostatitis   . Wears glasses     Past Surgical History:  Procedure Laterality Date  . COLONOSCOPY  01/2017   Dr. Loreta Ave, prior 2011    Social History   Socioeconomic History  . Marital status: Widowed    Spouse name: Not on file  . Number of children: 2  . Years of education: 75  . Highest education level: Not on file  Occupational History  . Occupation: Air traffic controller: [employer]  Social Needs  . Financial resource strain: Not on file  . Food insecurity    Worry: Not on file    Inability: Not on file  . Transportation needs    Medical: Not on file    Non-medical: Not on file  Tobacco Use  . Smoking status: Never Smoker  . Smokeless tobacco: Never Used  Substance and Sexual Activity  . Alcohol use: No    Alcohol/week: 0.0 standard drinks  . Drug use: No  . Sexual activity: Not on file  Lifestyle  . Physical activity    Days per week: Not on file    Minutes per session: Not on file  . Stress: Not on file  Relationships  . Social Musician on phone: Not on file    Gets together: Not on file    Attends religious service: Not on file    Active member of club or organization: Not on file    Attends meetings of clubs or organizations: Not on file    Relationship status:  Not on file  . Intimate partner violence    Fear of current or ex partner: Not on file    Emotionally abused: Not on file    Physically abused: Not on file    Forced sexual activity: Not on file  Other Topics Concern  . Not on file  Social History Narrative   Lives in a 2 story home.  Has 2 children. 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   Exercise - stretching, walking.  Attends church.  Education: college.  04/2019    Family History  Problem Relation Age of Onset  . Cancer Father        brain  . Stroke Father   . Diabetes Sister   . Diabetes Mother   . Other Brother        death related to a fall, head contusion  . Asthma Sister   . Heart disease Neg Hx      Current Outpatient Medications:  .  cholecalciferol (VITAMIN D) 25 MCG (1000 UT) tablet, TAKE 1 TABLET BY MOUTH EVERY DAY, Disp: 30 tablet, Rfl: 0 .  Cyanocobalamin (VITAMIN B12 PO), Take by mouth., Disp: , Rfl:  .  gabapentin (NEURONTIN) 100 MG capsule,  Take 2 capsules (200 mg total) by mouth 2 (two) times daily., Disp: 360 capsule, Rfl: 3  Allergies  Allergen Reactions  . Ciprofloxacin     Achilles tendon ache, finger aches     Review of Systems Constitutional: -fever, -chills, -sweats, -unexpected weight change, -decreased appetite, -fatigue Allergy: -sneezing, -itching, -congestion Dermatology: -changing moles, --rash, -lumps ENT: -runny nose, +ear pain, -sore throat, -hoarseness, -sinus pain, -teeth pain, - ringing in ears, -hearing loss, -nosebleeds Cardiology: -chest pain, -palpitations, -swelling, -difficulty breathing when lying flat, -waking up short of breath Respiratory: -cough, -shortness of breath, -difficulty breathing with exercise or exertion, -wheezing, -coughing up blood Gastroenterology: -abdominal pain, -nausea, -vomiting, -diarrhea, -constipation, -blood in stool, -changes in bowel movement, -difficulty swallowing or eating Hematology: -bleeding, -bruising  Musculoskeletal: -joint aches, -muscle  aches, -joint swelling, -back pain, -neck pain, -cramping, -changes in gait Ophthalmology: denies vision changes, eye redness, itching, discharge Urology: -burning with urination, -difficulty urinating, -blood in urine, -urinary frequency, -urgency, -incontinence Neurology: -headache, -weakness, -tingling, -numbness, -memory loss, -falls, -dizziness Psychology: -depressed mood, -agitation, -sleep problems Male GU: no testicular mass, pain, no lymph nodes swollen, no swelling, no rash.     Objective:  BP 120/76   Pulse 68   Temp 98.9 F (37.2 C)   Ht 6\' 1"  (1.854 m)   Wt 211 lb (95.7 kg)   BMI 27.84 kg/m   General appearance: alert, no distress, WD/WN, African American male Skin: no worrisome lesions HEENT: normocephalic, conjunctiva/corneas normal, sclerae anicteric, PERRLA, EOMi, right TM flat, no erythema, left TM and canal normal, nares patent, no discharge or erythema, pharynx normal Oral cavity: MMM, tongue normal, teeth normal Neck: supple, no lymphadenopathy, no thyromegaly, no masses, normal ROM, no bruits Chest: non tender, normal shape and expansion Heart: RRR, normal S1, S2, no murmurs Lungs: CTA bilaterally, no wheezes, rhonchi, or rales Abdomen: +bs, soft, non tender, non distended, no masses, no hepatomegaly, no splenomegaly, no bruits Back: non tender, normal ROM, no scoliosis Musculoskeletal: upper extremities non tender, no obvious deformity, normal ROM throughout, lower extremities non tender, no obvious deformity, normal ROM throughout Extremities: no edema, no cyanosis, no clubbing Pulses: 2+ symmetric, upper and lower extremities, normal cap refill Neurological: alert, oriented x 3, CN2-12 intact, strength normal upper extremities and lower extremities, sensation normal throughout, DTRs 2+ throughout, no cerebellar signs, gait normal Psychiatric: normal affect, behavior normal, pleasant  GU/rectal - deferred to urology  EKG indication physical, screening for  heart disease, rate 60 bpm, PR 148 ms, QRS 90 ms, QTc 402 general sinus, axis 29 degrees, normal sinus rhythm     Assessment and Plan :   Encounter Diagnoses  Name Primary?  . Encounter for health maintenance examination in adult Yes  . Need for influenza vaccination   . Pelvic floor dysfunction   . Chronic prostatitis   . Vaccine counseling   . Discomfort of ear, unspecified laterality   . Lumbar spondylolysis   . Low serum vitamin D     Physical exam - discussed and counseled on healthy lifestyle, diet, exercise, preventative care, vaccinations, sick and well care, proper use of emergency dept and after hours care, and addressed their concerns.    Health screening: See your eye doctor yearly for routine vision care. See your dentist yearly for routine dental care including hygiene visits twice yearly.  Cancer screening Colonoscopy:  Reviewed colonoscopy on file that is up to date  Discussed PSA, prostate exam, and prostate cancer screening risks/benefits.  PSA lab reviewed from  urology from 2020   Vaccinations: Advised yearly influenza vaccine Counseled on the influenza virus vaccine.  Vaccine information sheet given.  Influenza vaccine given after consent obtained.   Shingles vaccine:  I recommend you have a shingles vaccine to help prevent shingles or herpes zoster outbreak.   Please call your insurer to inquire about coverage for the Shingrix vaccine given in 2 doses.   Some insurers cover this vaccine after age 59, some cover this after age 59.  If your insurer covers this, then call to schedule appointment to have this vaccine here.   Separate significant chronic issues discussed: Ear discomfort -in the past month he is already done a round of antibiotic, he is already tried Sudafed with some improvement.  Currently on no medication he still has the symptoms.  Begin 1 week of Flonase in the morning and Afrin for 3 days at nighttime.  Call report within 1 week, if not  improving, will try a round of oral prednisone or we can refer to ENT  Pelvic floor dysfunction - managed by urology  Lumbar spondylolysis - on gabapentin per neurology   Essa was seen today for fasting cpe.  Diagnoses and all orders for this visit:  Encounter for health maintenance examination in adult -     Comprehensive metabolic panel -     CBC with Differential/Platelet -     VITAMIN D 25 Hydroxy (Vit-D Deficiency, Fractures) -     Lipid panel -     EKG 12-Lead  Need for influenza vaccination -     Flu Vaccine QUAD 36+ mos IM  Pelvic floor dysfunction  Chronic prostatitis  Vaccine counseling  Discomfort of ear, unspecified laterality  Lumbar spondylolysis  Low serum vitamin D -     VITAMIN D 25 Hydroxy (Vit-D Deficiency, Fractures)    Follow-up pending labs, yearly for physical

## 2019-05-17 ENCOUNTER — Other Ambulatory Visit: Payer: Self-pay | Admitting: Medical

## 2019-05-17 ENCOUNTER — Other Ambulatory Visit: Payer: Self-pay

## 2019-05-17 DIAGNOSIS — R17 Unspecified jaundice: Secondary | ICD-10-CM

## 2019-05-17 DIAGNOSIS — D696 Thrombocytopenia, unspecified: Secondary | ICD-10-CM

## 2019-05-17 LAB — COMPREHENSIVE METABOLIC PANEL
ALT: 39 IU/L (ref 0–44)
AST: 35 IU/L (ref 0–40)
Albumin/Globulin Ratio: 1.7 (ref 1.2–2.2)
Albumin: 4.3 g/dL (ref 3.8–4.9)
Alkaline Phosphatase: 75 IU/L (ref 39–117)
BUN/Creatinine Ratio: 17 (ref 9–20)
BUN: 17 mg/dL (ref 6–24)
Bilirubin Total: 2.2 mg/dL — ABNORMAL HIGH (ref 0.0–1.2)
CO2: 21 mmol/L (ref 20–29)
Calcium: 9.1 mg/dL (ref 8.7–10.2)
Chloride: 103 mmol/L (ref 96–106)
Creatinine, Ser: 1.01 mg/dL (ref 0.76–1.27)
GFR calc Af Amer: 94 mL/min/{1.73_m2} (ref >59)
GFR calc non Af Amer: 82 mL/min/{1.73_m2} (ref >59)
Globulin, Total: 2.5 g/dL (ref 1.5–4.5)
Glucose: 83 mg/dL (ref 65–99)
Potassium: 4 mmol/L (ref 3.5–5.2)
Sodium: 139 mmol/L (ref 134–144)
Total Protein: 6.8 g/dL (ref 6.0–8.5)

## 2019-05-17 LAB — CBC WITH DIFFERENTIAL/PLATELET
Basophils Absolute: 0 10*3/uL (ref 0.0–0.2)
Basos: 1 %
EOS (ABSOLUTE): 0.1 10*3/uL (ref 0.0–0.4)
Eos: 2 %
Hematocrit: 45.4 % (ref 37.5–51.0)
Hemoglobin: 15 g/dL (ref 13.0–17.7)
Immature Grans (Abs): 0 10*3/uL (ref 0.0–0.1)
Immature Granulocytes: 0 %
Lymphocytes Absolute: 2 10*3/uL (ref 0.7–3.1)
Lymphs: 49 %
MCH: 29.1 pg (ref 26.6–33.0)
MCHC: 33 g/dL (ref 31.5–35.7)
MCV: 88 fL (ref 79–97)
Monocytes Absolute: 0.4 10*3/uL (ref 0.1–0.9)
Monocytes: 11 %
Neutrophils Absolute: 1.5 10*3/uL (ref 1.4–7.0)
Neutrophils: 37 %
Platelets: 105 10*3/uL — ABNORMAL LOW (ref 150–450)
RBC: 5.16 x10E6/uL (ref 4.14–5.80)
RDW: 13.2 % (ref 11.6–15.4)
WBC: 4.1 10*3/uL (ref 3.4–10.8)

## 2019-05-17 LAB — VITAMIN D 25 HYDROXY (VIT D DEFICIENCY, FRACTURES): Vit D, 25-Hydroxy: 26.9 ng/mL — ABNORMAL LOW (ref 30.0–100.0)

## 2019-05-17 LAB — LIPID PANEL
Chol/HDL Ratio: 3.4 ratio (ref 0.0–5.0)
Cholesterol, Total: 221 mg/dL — ABNORMAL HIGH (ref 100–199)
HDL: 65 mg/dL (ref >39)
LDL Calculated: 142 mg/dL — ABNORMAL HIGH (ref 0–99)
Triglycerides: 70 mg/dL (ref 0–149)
VLDL Cholesterol Cal: 14 mg/dL (ref 5–40)

## 2019-05-18 MED ORDER — VITAMIN D3 25 MCG (1000 UNIT) PO TABS: 1000.0000 [IU] | ORAL_TABLET | Freq: Every day | ORAL | 0 refills | Status: DC

## 2019-05-19 LAB — SPECIMEN STATUS REPORT

## 2019-05-19 LAB — BILIRUBIN, FRACTIONATED(TOT/DIR/INDIR)
Bilirubin Total: 1.8 mg/dL — ABNORMAL HIGH (ref 0.0–1.2)
Bilirubin, Direct: 0.34 mg/dL (ref 0.00–0.40)
Bilirubin, Indirect: 1.46 mg/dL — ABNORMAL HIGH (ref 0.10–0.80)

## 2019-06-01 ENCOUNTER — Other Ambulatory Visit: Payer: Self-pay | Admitting: Medical

## 2019-06-01 DIAGNOSIS — D696 Thrombocytopenia, unspecified: Secondary | ICD-10-CM

## 2019-06-01 MED ORDER — PRAVASTATIN SODIUM 20 MG PO TABS: 20.0000 mg | ORAL_TABLET | Freq: Every evening | ORAL | 3 refills | Status: DC

## 2019-06-07 ENCOUNTER — Other Ambulatory Visit: Payer: Self-pay | Admitting: Medical

## 2019-06-07 ENCOUNTER — Encounter: Payer: Self-pay | Admitting: Neurology

## 2019-06-12 ENCOUNTER — Encounter: Payer: Self-pay | Admitting: Neurology

## 2019-06-12 ENCOUNTER — Ambulatory Visit: Payer: BC Managed Care – PPO | Admitting: Medical

## 2019-06-12 ENCOUNTER — Ambulatory Visit: Payer: BC Managed Care – PPO | Admitting: Neurology

## 2019-06-12 ENCOUNTER — Other Ambulatory Visit: Payer: Self-pay

## 2019-06-12 ENCOUNTER — Encounter: Payer: Self-pay | Admitting: Medical

## 2019-06-12 VITALS — BP 110/68 | HR 72 | Temp 97.8°F | Ht 73.0 in | Wt 211.6 lb

## 2019-06-12 VITALS — BP 140/90 | HR 82 | Ht 73.0 in | Wt 209.0 lb

## 2019-06-12 DIAGNOSIS — M4307 Spondylolysis, lumbosacral region: Secondary | ICD-10-CM | POA: Diagnosis not present

## 2019-06-12 DIAGNOSIS — R202 Paresthesia of skin: Secondary | ICD-10-CM | POA: Diagnosis not present

## 2019-06-12 DIAGNOSIS — H9201 Otalgia, right ear: Secondary | ICD-10-CM

## 2019-06-12 DIAGNOSIS — M79672 Pain in left foot: Secondary | ICD-10-CM | POA: Diagnosis not present

## 2019-06-12 DIAGNOSIS — R200 Anesthesia of skin: Secondary | ICD-10-CM

## 2019-06-12 NOTE — Progress Notes (Signed)
Follow-up Visit   Date: 06/12/19    Joshua Torres MRN: [id number] DOB: [birth year]   Interim History: Joshua Torres is a 58 y.o. right-handed African American male returning to the clinic for follow-up of bilateral feet paresthesias.  The patient was accompanied to the clinic by self.  History of present illness: He was getting doxycycline for prostatitis in March and after three weeks of antibiotics treatment, he began noticing burning sensation over the dorsum of the feet and over the past few weeks, it has involved the soles of the feet.  Symptoms can vary in duration and location, but tends to involve the right foot more.  He has noticed exercise improves the discomfort, and it is worse with prolonged sitting and walking, which can involve his thighs also. He denies weakness or imbalance.  He has long history of chronic low back pain and was treated with ESI 20+ years ago.  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  He is being treated with prostatitis and having pelvic therapy about once per month.   UPDATE 06/01/2018:  There has been significant changes in his personal life.  He wife of 31 years passed away very recently on 05-24-23 from liver cancer and he is grieving her loss.  He does reports having good social support. With respect to his feet paresthesias, he had marked response to PT and when he was regularly attending PT, pain was minimal.  He reduced the frequency of PT sessions after his wife became ill and during this time, he noticed that burning pain was slowly returning in the feet, worse on the right, but overall, it is still about 50% reduction in pain compared to before.    UPDATE 12/05/2018:  He is here for 6 month follow-up visit.  His leg paresthesias and pain are improved with physical therapy exercises and gabapentin 200mg  BID.  He is pleased with the relief he has and does not want to increase the dose.  He denies low back pain, cramps.   UPDATE 06/12/2019:   He is here for 6 month follow-up visit.  He was doing very well with respect to low back pain and tingling of the feet until early September.  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  About 1-2 days later, he developed tingling and numbness over the left arm and leg.  He also had low back pain raditing into the left leg.  Symptoms lasted about 4-days and then started to improve.  He only rarely gets tingling of the left arm now.  He also complains of sharp pain in the left heel, which is worse with weight bearing, such as standing and walking.  He did not have weakness in the arm or leg.  No tingling over the face.     Medications:  Current Outpatient Medications on File Prior to Visit  Medication Sig Dispense Refill  . cholecalciferol (VITAMIN D) 25 MCG (1000 UT) tablet Take 1 tablet (1,000 Units total) by mouth daily. 30 tablet 0  . Cyanocobalamin (VITAMIN B12 PO) Take by mouth.    . gabapentin (NEURONTIN) 100 MG capsule Take 2 capsules (200 mg total) by mouth 2 (two) times daily. 360 capsule 3  . pravastatin (PRAVACHOL) 20 MG tablet Take 1 tablet (20 mg total) by mouth every evening. 90 tablet 3   No current facility-administered medications on file prior to visit.     Allergies:  Allergies  Allergen Reactions  . Ciprofloxacin     Achilles tendon ache, finger aches    Review of Systems:  CONSTITUTIONAL: No fevers, chills, night sweats, or weight loss.  EYES: No visual changes or eye pain ENT: No hearing changes.  No history of nose bleeds.   RESPIRATORY: No cough, wheezing and shortness of breath.   CARDIOVASCULAR: Negative for chest pain, and palpitations.   GI: Negative for abdominal discomfort, blood in stools or black stools.  No recent change in bowel habits.   GU:  No history of incontinence.   MUSCLOSKELETAL: No history of joint pain or swelling.  No myalgias.   SKIN: Negative for lesions, rash, and itching.   ENDOCRINE: Negative for  cold or heat intolerance, polydipsia or goiter.   PSYCH:  + depressed or anxiety symptoms.   NEURO: As Above.   Vital Signs:  BP 140/90   Pulse 82   Ht 6\' 1"  (1.854 m)   Wt 209 lb (94.8 kg)   SpO2 98%   BMI 27.57 kg/m   General Medical Exam:   General:  Well appearing, comfortable  Eyes/ENT: see cranial nerve examination.   Neck:   No carotid bruits. Respiratory:  Clear to auscultation, good air entry bilaterally.   Cardiac:  Regular rate and rhythm, no murmur.   Ext:  Point tenderness over the left medial heel to palpation  Neurological Exam: MENTAL STATUS including orientation to time, place, person, recent and remote memory, attention span and concentration, language, and fund of knowledge is normal.  Speech is not dysarthric.  CRANIAL NERVES: No visual field defects.  Pupils equal round and reactive to light.  Normal conjugate, extra-ocular eye movements in all directions of gaze.  No ptosis. Normal facial sensation.  Face is symmetric.   MOTOR:  Motor strength is 5/5 in all extremities.  No pronator drift.  Tone is normal.    MSRs:  Right                                                                 Left brachioradialis 2+  brachioradialis 2+  biceps 2+  biceps 2+  triceps 2+  triceps 2+  patellar 3+  patellar 3+  ankle jerk 3+  ankle jerk 2+   SENSORY:  Intact to vibration throughout.  COORDINATION/GAIT:  Normal finger-to- nose-finger and heel-to-shin.  Intact rapid alternating movements bilaterally.  Gait mildly antalgic due to left heel pain, stable and unassisted   Data: n/a  IMPRESSION/PLAN: 1.  Left arm and leg numbness, possible cervical canal stenosis, however with the hemisensory changes, will evaluate for stroke   - CT head wo contrast  2.  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  In the past, he has responded well to physical therapy  - Continue gabapentin 200mg  twice daily  - Continue home  exercises  - MRI lumbar spine, if symptoms get worse  3. Left heel pain worse with weight bearing, ?bone spur or plantar fasciitis   - Recommend seeing podiatry, but he would like to discuss this with his PCP first  Further recommendations pending results.   Thank you for allowing me to participate in patient's care.  If I can answer any additional questions, I would be pleased to do  so.    Sincerely,    Suhan Paci K. Posey Pronto, DO

## 2019-06-12 NOTE — Patient Instructions (Signed)
We will order CT head

## 2019-06-12 NOTE — Progress Notes (Signed)
Subjective: Chief Complaint  Patient presents with  . Ear Pain    right x1-2 months-medication stop working in the past    Here for ongoing right ear pain only.  No teeth pain, no pain chewing.  No ringing in ear, no hearing problem unless water gets in the ear while during the shower.  Hours after a shower, can have discomfort in ear.  Gets some occasional headaches.   Pain is intermittent.    No runny, no sneezing . Occasionally has some nasal congestion.  No sore throat, no loud noise exposure, no TMJ pain, no recent dental issues.   He got first shingrix dose with [facility] this past week.      Objective: BP 110/68   Pulse 72   Temp 97.8 F (36.6 C)   Ht 6\' 1"  (1.854 m)   Wt 211 lb 9.6 oz (96 kg)   SpO2 97%   BMI 27.92 kg/m   General appearance: alert, no distress, WD/WN,  HEENT: normocephalic, sclerae anicteric, TMs pearly, canals normal, nares patent, no discharge or erythema, pharynx normal, no TMJ tenderness Oral cavity: MMM, no lesions, teeth in good repair, no obvious decay or abnormality Neck: supple, no lymphadenopathy, no thyromegaly, no masses CN2-12 intact      Assessment: Encounter Diagnosis  Name Primary?  Joshua Torres of right ear Yes    Plan: advised he use zyrtec oral and flonase nasal daily for the next month in the event this is allergy/sinus related  He can use alcohol drops after showering daily  We will go ahead and refer to ENT due to ongoing pain despite treatment with antibiotic for sinusitis recently, trial of allergy medication prior that helped although he isn't taking this consistently daily, and given no obvious abnormality on exam today   Camp was seen today for ear pain.  Diagnoses and all orders for this visit:  Otalgia of right ear -     Ambulatory referral to ENT

## 2019-06-18 ENCOUNTER — Other Ambulatory Visit: Payer: Self-pay | Admitting: Medical

## 2019-06-20 ENCOUNTER — Ambulatory Visit: Payer: BC Managed Care – PPO | Admitting: Medical

## 2019-06-20 ENCOUNTER — Telehealth: Payer: Self-pay

## 2019-06-20 ENCOUNTER — Encounter: Payer: Self-pay | Admitting: Medical

## 2019-06-20 ENCOUNTER — Other Ambulatory Visit: Payer: Self-pay

## 2019-06-20 ENCOUNTER — Telehealth: Payer: Self-pay | Admitting: Neurology

## 2019-06-20 VITALS — BP 120/86 | HR 80 | Temp 97.8°F | Ht 73.0 in | Wt 207.4 lb

## 2019-06-20 DIAGNOSIS — M542 Cervicalgia: Secondary | ICD-10-CM

## 2019-06-20 DIAGNOSIS — R200 Anesthesia of skin: Secondary | ICD-10-CM | POA: Diagnosis not present

## 2019-06-20 DIAGNOSIS — R42 Dizziness and giddiness: Secondary | ICD-10-CM | POA: Diagnosis not present

## 2019-06-20 DIAGNOSIS — R202 Paresthesia of skin: Secondary | ICD-10-CM

## 2019-06-20 DIAGNOSIS — F419 Anxiety disorder, unspecified: Secondary | ICD-10-CM | POA: Diagnosis not present

## 2019-06-20 NOTE — Patient Instructions (Signed)
Please go to [facility] for your neck xray.   Their hours are 8am - 3:30 pm Monday - Friday.  Take your insurance card with you.  [facility] [phone]  [address], [place], [place] [address]  [address] [place], [place] [address]

## 2019-06-20 NOTE — Progress Notes (Signed)
Subjective: Chief Complaint  Patient presents with  . Fatigue    dizziness  and numbness this morning-feels better now    Here for not feeling right this morning.  This morning felt weak, tired, had some tingling in fingertips and forearm slight in left arm.  Didn't feel comfortable going to work.  Saw neurology a week ago for pain and tingling in arm, and has CT head scheduled to evaluate this.  No headache, no confusion.  yesterday was dizzy walking in grocery store.   No chest pain, no dyspnea.  Hydration has been good.  No significant strenuous activity the last few days.   Exercises daily, stretches daily.   No recent driving with elevation change such as mountains.     He wonders if this is related to stress and anxiety.  In general feels uneasy about politics and the craziness in the world right now with the election year, racial tension, protest going on.  He also feels lonely and at a loss given the death of his wife last year.  Both of his adult children are living at home at the time of her death.  After she passed both children ended up moving out into their own places.  And then when the COVID pandemic hit he has felt more alone since people not gathering as usual.  He also notes history of neck pain for years although it has not been bad enough most the time to come in and talk about it.  He notes having a bulging disc noted on imaging studies 20 years ago but has never had any type of procedure.  Pain at times radiates down the left arm and lately has been having more this.  He does get tingling in his left arm specifically.  No recent injury or trauma.  No other aggravating or relieving factors. No other complaint.   Past Medical History:  Diagnosis Date  . GERD (gastroesophageal reflux disease)   . Osteoarthritis    left middle finger  . Prostatitis   . Wears glasses    Current Outpatient Medications on File Prior to Visit  Medication Sig Dispense Refill  . Cyanocobalamin  (VITAMIN B12 PO) Take by mouth.    . D3-1000 25 MCG (1000 UT) tablet TAKE 1 TABLET BY MOUTH EVERY DAY 30 tablet 0  . gabapentin (NEURONTIN) 100 MG capsule Take 2 capsules (200 mg total) by mouth 2 (two) times daily. 360 capsule 3  . pravastatin (PRAVACHOL) 20 MG tablet Take 1 tablet (20 mg total) by mouth every evening. 90 tablet 3   No current facility-administered medications on file prior to visit.    Family History  Problem Relation Age of Onset  . Cancer Father        brain  . Stroke Father   . Diabetes Sister   . Diabetes Mother   . Other Brother        death related to a fall, head contusion  . Asthma Sister   . Heart disease Neg Hx    Past Surgical History:  Procedure Laterality Date  . COLONOSCOPY  01/2017   Dr. Loreta Ave, prior 2011    ROS as in subjective    Objective: BP 120/86   Pulse 80   Temp 97.8 F (36.6 C)   Ht 6\' 1"  (1.854 m)   Wt 207 lb 6.4 oz (94.1 kg)   SpO2 96%   BMI 27.36 kg/m    General appearance: alert, no distress, WD/WN,  HEENT:  normocephalic, sclerae anicteric, PERRLA, EOMi, nares patent, no discharge or erythema, pharynx normal Oral cavity: MMM, no lesions Neck: supple, no lymphadenopathy, no thyromegaly, no masses Heart: RRR, normal S1, S2, no murmurs Lungs: CTA bilaterally, no wheezes, rhonchi, or rales Back: non tender Musculoskeletal: nontender, no swelling, no obvious deformity Extremities: no edema, no cyanosis, no clubbing Pulses: 2+ symmetric, upper and lower extremities, normal cap refill Neurological: alert, oriented x 3, CN2-12 intact, strength normal upper extremities and lower extremities, sensation normal throughout, DTRs 2+ throughout, no cerebellar signs, gait normal Psychiatric: normal affect, behavior normal, pleasant     Assessment: Encounter Diagnoses  Name Primary?  . Neck pain Yes  . Numbness and tingling in left arm   . Dizziness   . Anxiety     Plan: We discussed his symptoms.  He is lower risk in  general and has been healthy.  His numbness symptoms probably have more to do with neck pain and radicular issues in the neck.  This is been longstanding mild although he has not really discussed this in the past.  We will send for baseline x-ray.  The other symptoms could be more related to anxiety.  He tends to be a quiet person that has not really discussed his feelings and emotions related to his wife's passing last year.  However he now voices feeling alone as a year ago his kids and wife are at home and shortly after she passed the children moved out.  We discussed seeking counseling.  We discussed trying to connect with friends and having some social interaction.  We were able to move of his head CT to tomorrow that was originally scheduled for next week.  He would feel better to get this out of the way and be reassured if normal.  We discussed symptoms and signs of stroke and heart attack although this seems less likely.  Tore was seen today for fatigue.  Diagnoses and all orders for this visit:  Neck pain -     DG Cervical Spine Complete; Future  Numbness and tingling in left arm -     DG Cervical Spine Complete; Future  Dizziness  Anxiety

## 2019-06-20 NOTE — Telephone Encounter (Signed)
Patient advised to call with report after visit today

## 2019-06-20 NOTE — Telephone Encounter (Signed)
PCP appt today because of weakness/ dizziness/ tingling in left hand. He was wanting to speak with the nurse. Thanks!

## 2019-06-21 ENCOUNTER — Ambulatory Visit
Admission: RE | Admit: 2019-06-21 | Discharge: 2019-06-21 | Disposition: A | Discharge status: Home / Self Care | Payer: BC Managed Care – PPO | Source: Ambulatory Visit | Attending: Medical | Admitting: Medical

## 2019-06-21 ENCOUNTER — Ambulatory Visit
Admission: RE | Admit: 2019-06-21 | Discharge: 2019-06-21 | Disposition: A | Discharge status: Home / Self Care | Payer: BC Managed Care – PPO | Source: Ambulatory Visit | Attending: Neurology | Admitting: Neurology

## 2019-06-21 DIAGNOSIS — M542 Cervicalgia: Secondary | ICD-10-CM

## 2019-06-21 DIAGNOSIS — R202 Paresthesia of skin: Secondary | ICD-10-CM

## 2019-06-21 DIAGNOSIS — R200 Anesthesia of skin: Secondary | ICD-10-CM

## 2019-06-22 ENCOUNTER — Telehealth: Payer: Self-pay

## 2019-06-22 ENCOUNTER — Other Ambulatory Visit: Payer: Self-pay

## 2019-06-22 DIAGNOSIS — M542 Cervicalgia: Secondary | ICD-10-CM

## 2019-06-22 DIAGNOSIS — M4306 Spondylolysis, lumbar region: Secondary | ICD-10-CM

## 2019-06-22 DIAGNOSIS — R202 Paresthesia of skin: Secondary | ICD-10-CM

## 2019-06-22 DIAGNOSIS — R200 Anesthesia of skin: Secondary | ICD-10-CM

## 2019-06-22 NOTE — Telephone Encounter (Signed)
-----   Message from Alda Berthold, DO sent at 06/22/2019  3:32 PM EDT ----- Please inform patient that his CT head looks good - no sign of stroke causing his arm numbness. Thanks.

## 2019-06-22 NOTE — Telephone Encounter (Signed)
Left message of the above  

## 2019-06-26 ENCOUNTER — Other Ambulatory Visit: Payer: BC Managed Care – PPO

## 2019-07-02 ENCOUNTER — Other Ambulatory Visit: Payer: Self-pay | Admitting: Medical

## 2019-07-12 ENCOUNTER — Other Ambulatory Visit: Payer: Self-pay

## 2019-07-13 ENCOUNTER — Other Ambulatory Visit: Payer: Self-pay | Admitting: Medical

## 2019-08-04 ENCOUNTER — Other Ambulatory Visit: Payer: Self-pay

## 2019-08-07 MED ORDER — VITAMIN D 25 MCG (1000 UNIT) PO TABS: 1000.0000 [IU] | ORAL_TABLET | Freq: Every day | ORAL | 0 refills | Status: DC

## 2019-08-21 ENCOUNTER — Other Ambulatory Visit: Payer: Self-pay

## 2019-08-22 MED ORDER — VITAMIN D 25 MCG (1000 UNIT) PO TABS: 1000.0000 [IU] | ORAL_TABLET | Freq: Every day | ORAL | 0 refills | Status: DC

## 2019-09-25 ENCOUNTER — Other Ambulatory Visit: Payer: Self-pay

## 2019-09-26 MED ORDER — VITAMIN D 25 MCG (1000 UNIT) PO TABS: 1000.0000 [IU] | ORAL_TABLET | Freq: Every day | ORAL | 0 refills | Status: DC

## 2019-10-22 ENCOUNTER — Other Ambulatory Visit: Payer: Self-pay

## 2019-10-23 MED ORDER — VITAMIN D 25 MCG (1000 UNIT) PO TABS: 1000.0000 [IU] | ORAL_TABLET | Freq: Every day | ORAL | 0 refills | Status: DC

## 2019-11-14 ENCOUNTER — Other Ambulatory Visit: Payer: Self-pay

## 2019-11-15 MED ORDER — VITAMIN D 25 MCG (1000 UNIT) PO TABS: 1000.0000 [IU] | ORAL_TABLET | Freq: Every day | ORAL | 0 refills | Status: DC

## 2019-11-16 ENCOUNTER — Other Ambulatory Visit: Payer: Self-pay | Admitting: Medical

## 2019-12-10 ENCOUNTER — Other Ambulatory Visit: Payer: Self-pay | Admitting: Medical

## 2019-12-27 IMAGING — CT CT HEAD W/O CM
3 of 4 series · 15 of 47 positions shown, 18 images · non-contrast
Comparison: None.

CLINICAL DATA: Left upper extremity numbness

EXAM:
CT HEAD WITHOUT CONTRAST
TECHNIQUE: Contiguous axial images were obtained from the base of the skull
through the vertex without intravenous contrast.

[Series 2: head 5.00 hr40 s3 axial ibhc · axial · 0.47mm/px · z∈[-630,-470]mm · 9 of 38 slices shown, 12 images]
[im 3/38  brain]
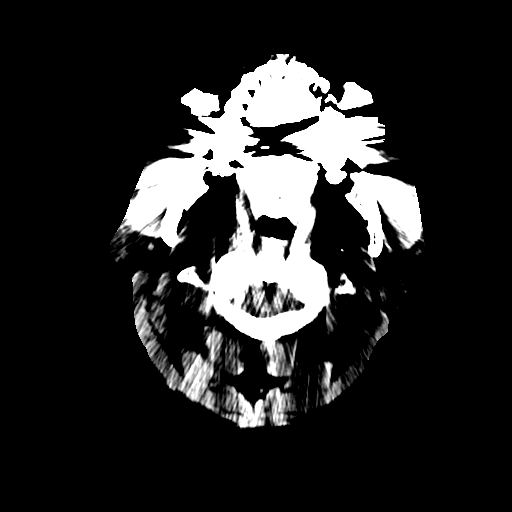
[im 3/38  bone]
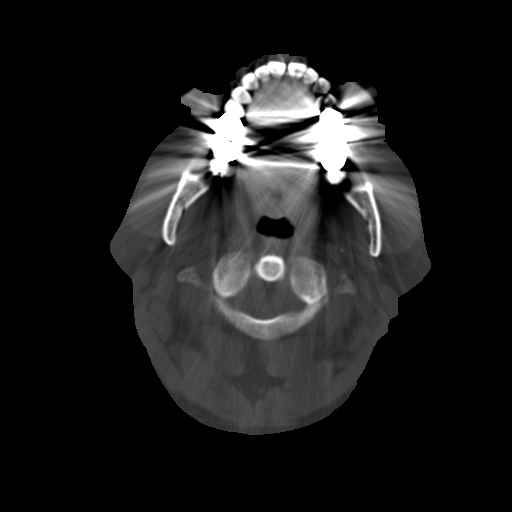
[im 8/38  brain]
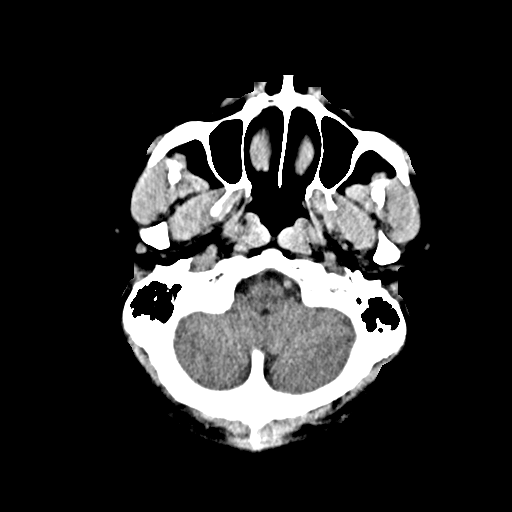
[im 11/38  brain]
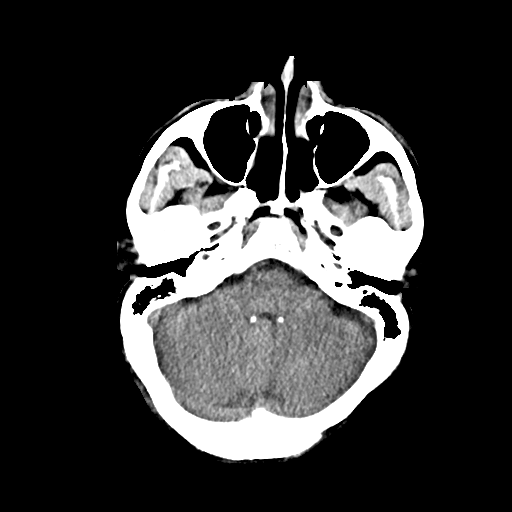
[im 16/38  brain]
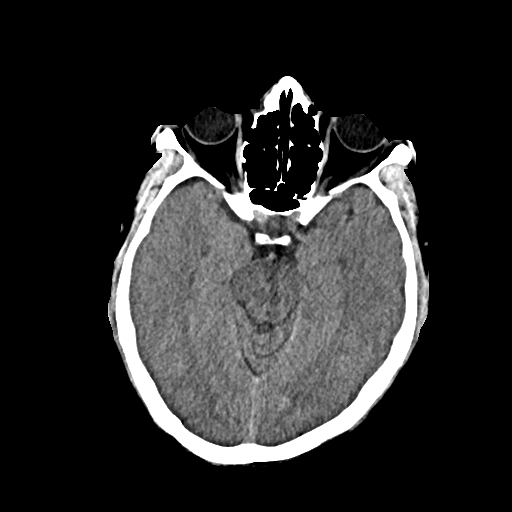
[im 19/38  brain]
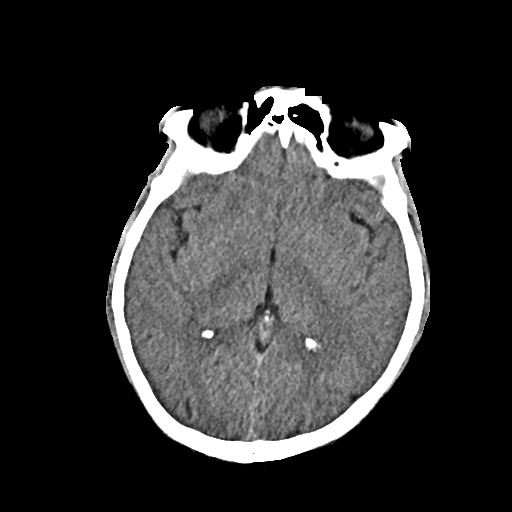
[im 19/38  bone]
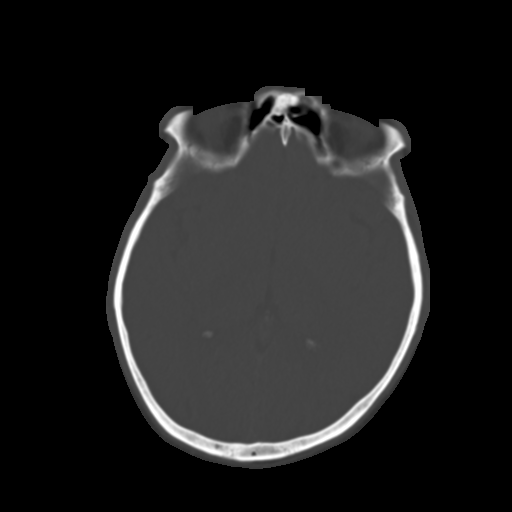
[im 22/38  brain]
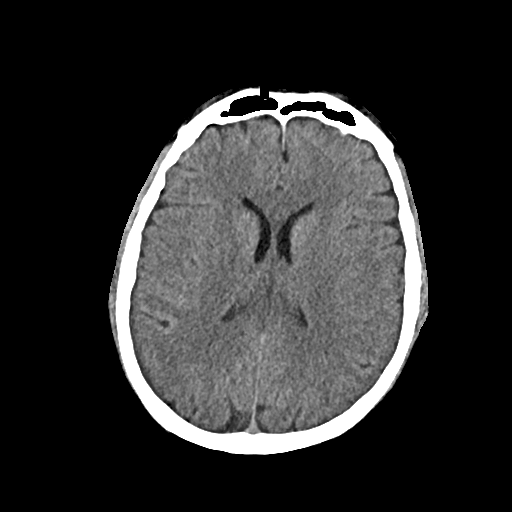
[im 27/38  brain]
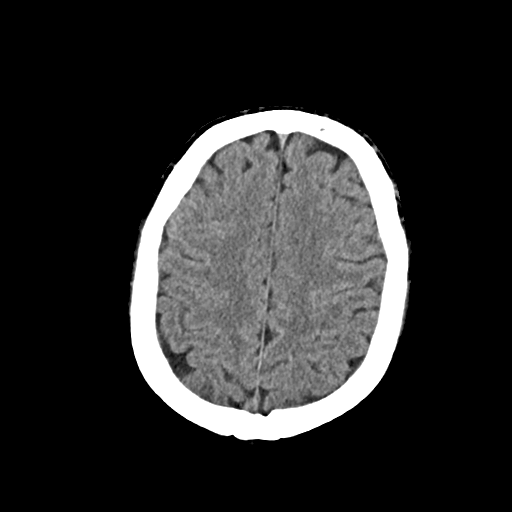
[im 30/38  brain]
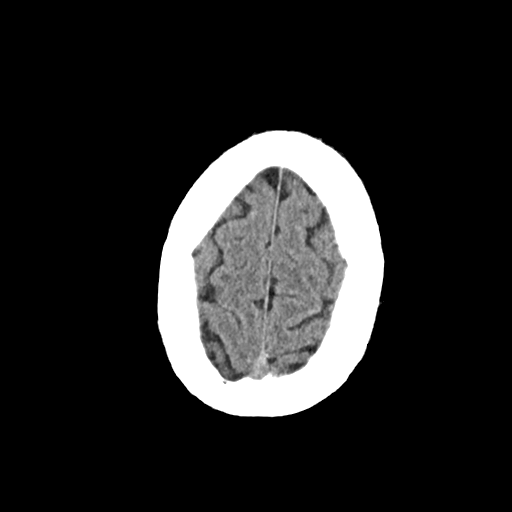
[im 35/38  brain]
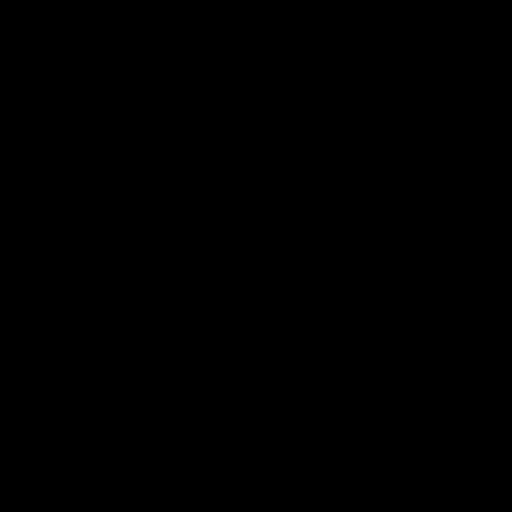
[im 35/38  bone]
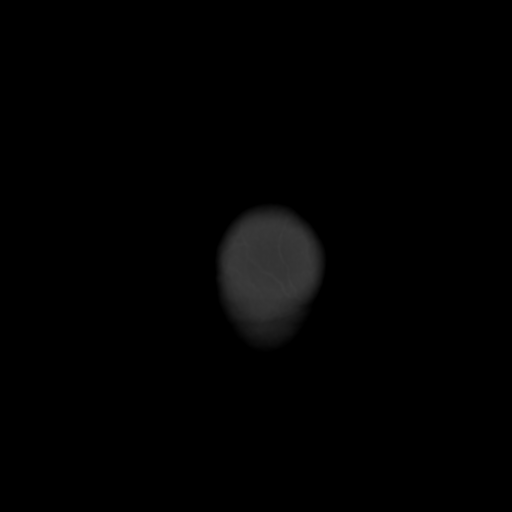

[Series 4: head 3.00 hr40 s3 sag · sagittal · 0.37mm/px · 3 of 80 slices shown]
[im 27/80  brain]
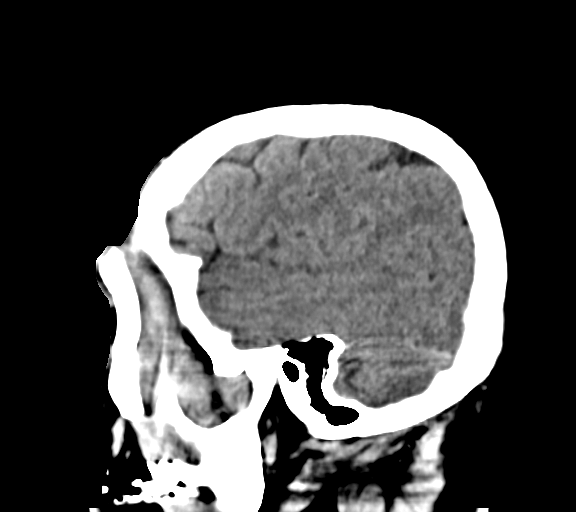
[im 40/80  brain]
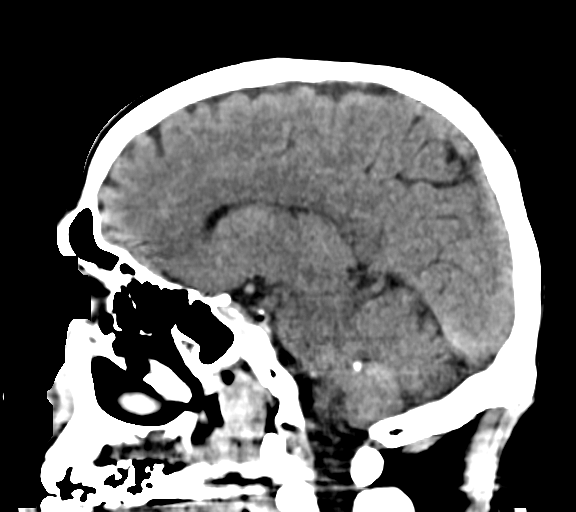
[im 53/80  brain]
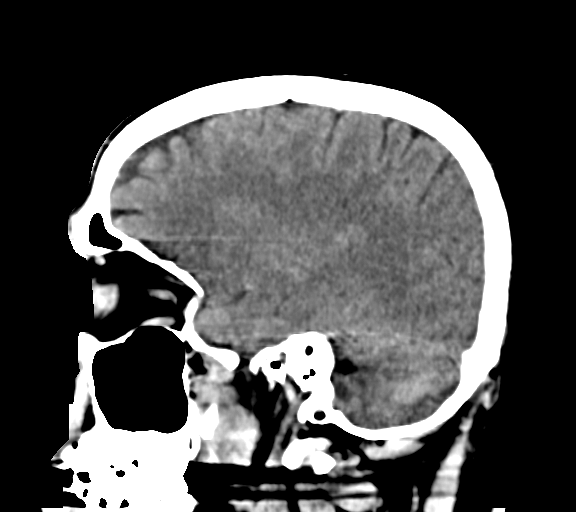

[Series 6: head 3.00 hr40 s3 cor · coronal · 0.37mm/px · 3 of 70 slices shown]
[im 24/70  brain]
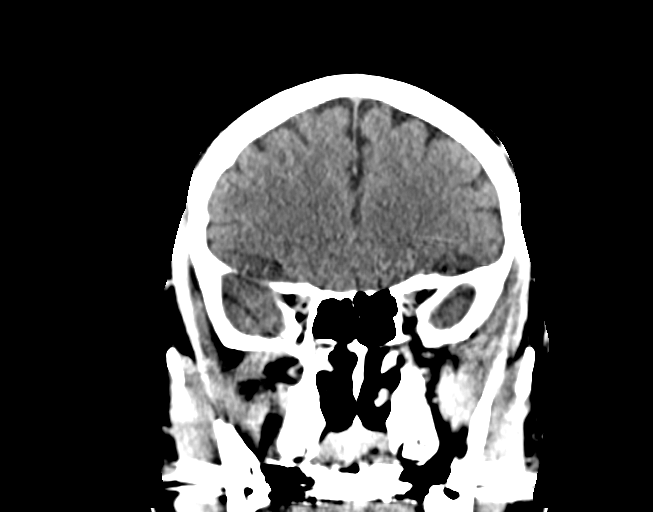
[im 31/70  brain]
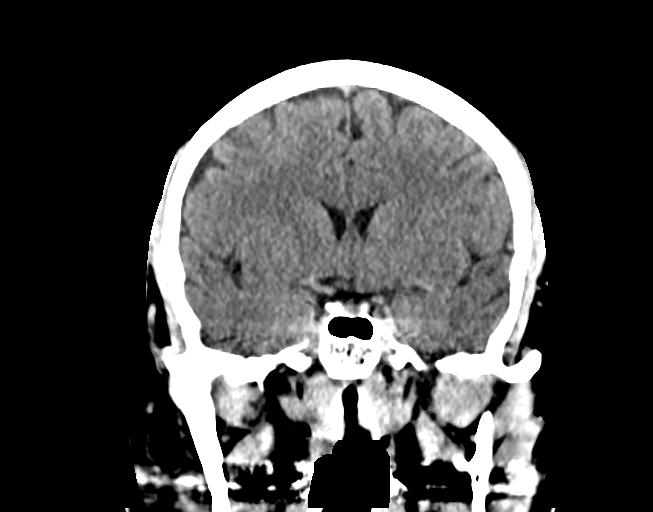
[im 39/70  brain]
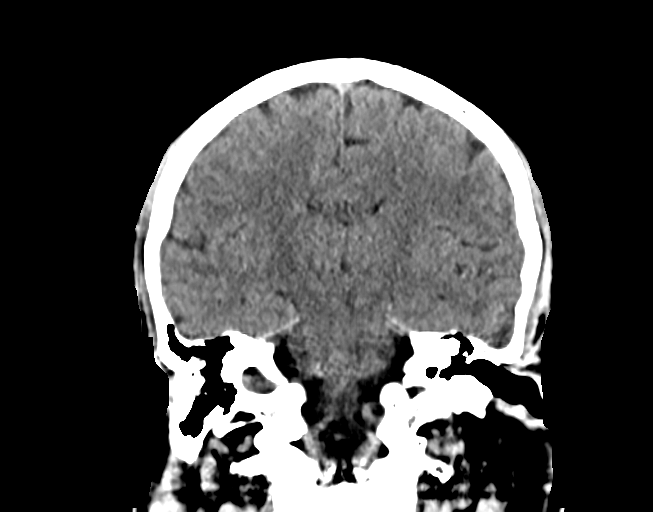

[15 of 47 positions shown; findings below may reference images not displayed]

FINDINGS: Brain: Ventricles and sulci are within normal limits. No evident
intracranial mass, hemorrhage, extra-axial fluid collection, or
midline shift. Brain parenchyma appears unremarkable. No evident
acute infarct.

Vascular: There is no hyperdense vessel. There is slight
calcification in the left carotid siphon.

Skull: The bony calvarium appears intact.

Sinuses/Orbits: Paranasal sinuses are clear. Orbits appear symmetric
bilaterally.

Other: Mastoid air cells are clear.
IMPRESSION: Brain parenchyma appears unremarkable. No acute infarct evident. No
mass or hemorrhage.

Rather minimal arterial vascular calcification noted.

## 2019-12-27 IMAGING — CR DG CERVICAL SPINE COMPLETE 4+V
5 series · 5 of 5 positions shown · non-contrast
Comparison: None.

CLINICAL DATA: Cervicalgia with left upper extremity paresthesias

EXAM:
CERVICAL SPINE - COMPLETE 4+ VIEW

[w c-spine oblique (1 of 3)]
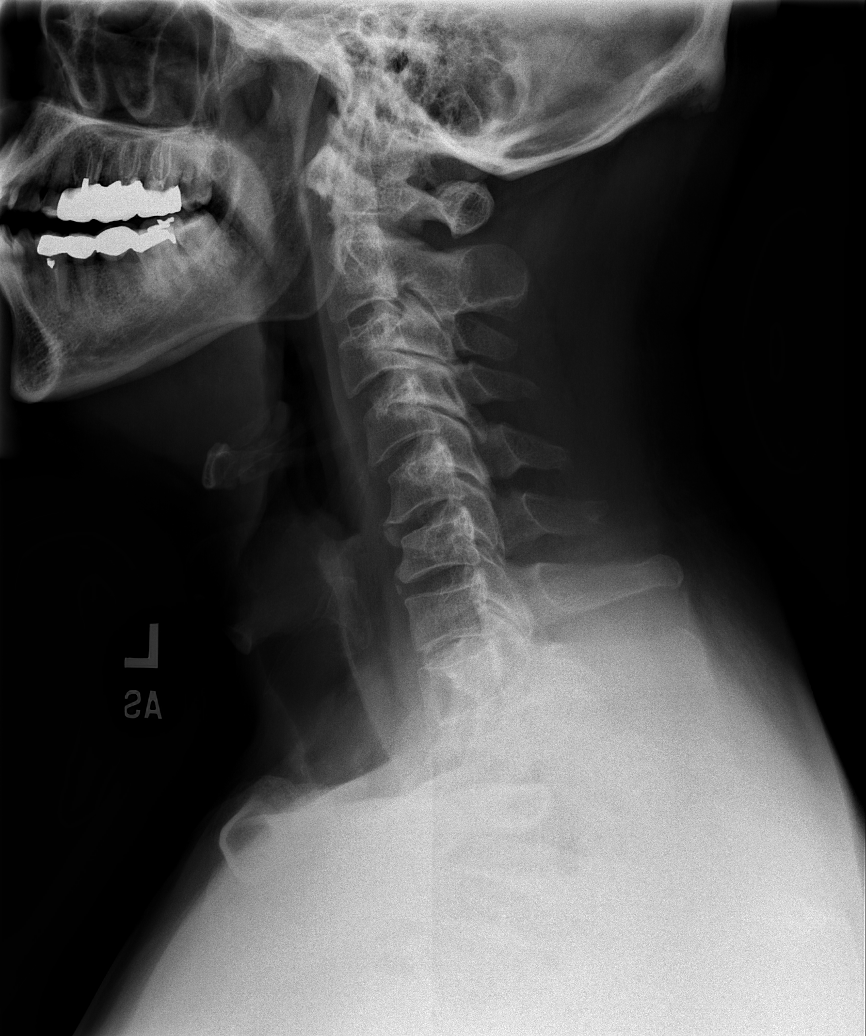

[w c-spine oblique (2 of 3)]
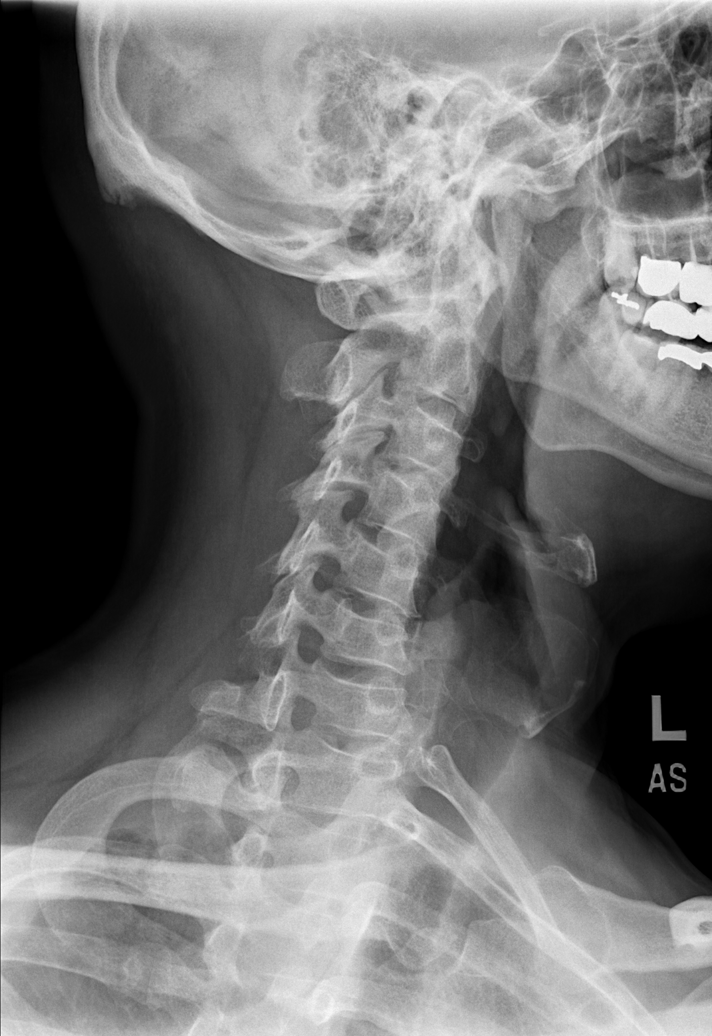

[w c-spine oblique (3 of 3)]
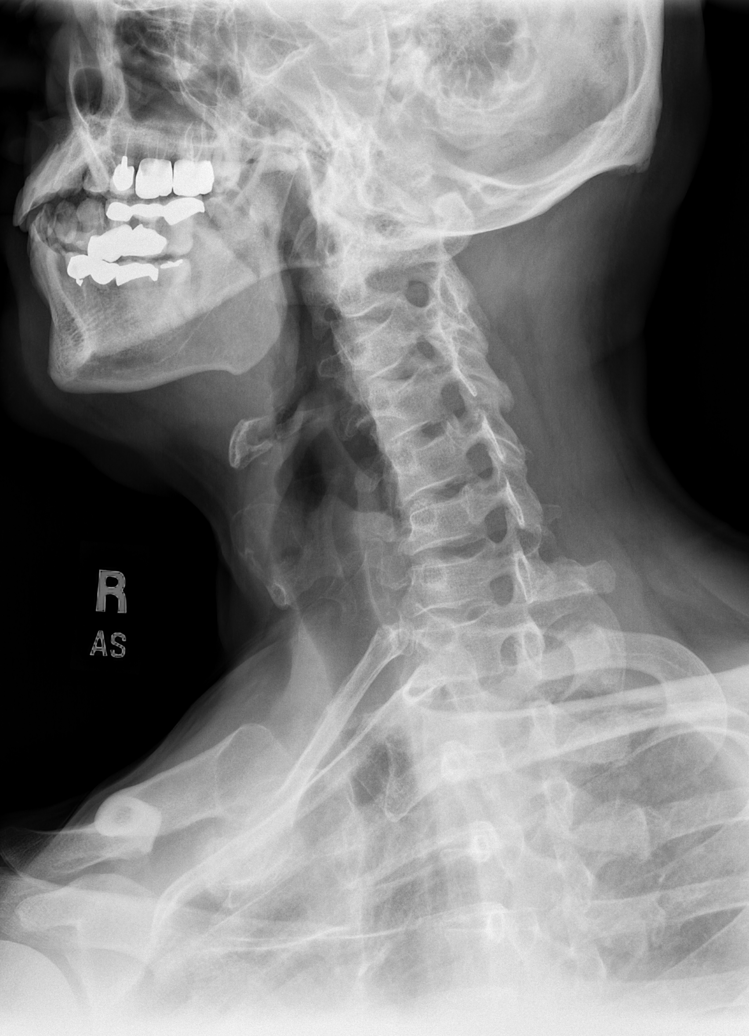

[w c-spine a.p. *]
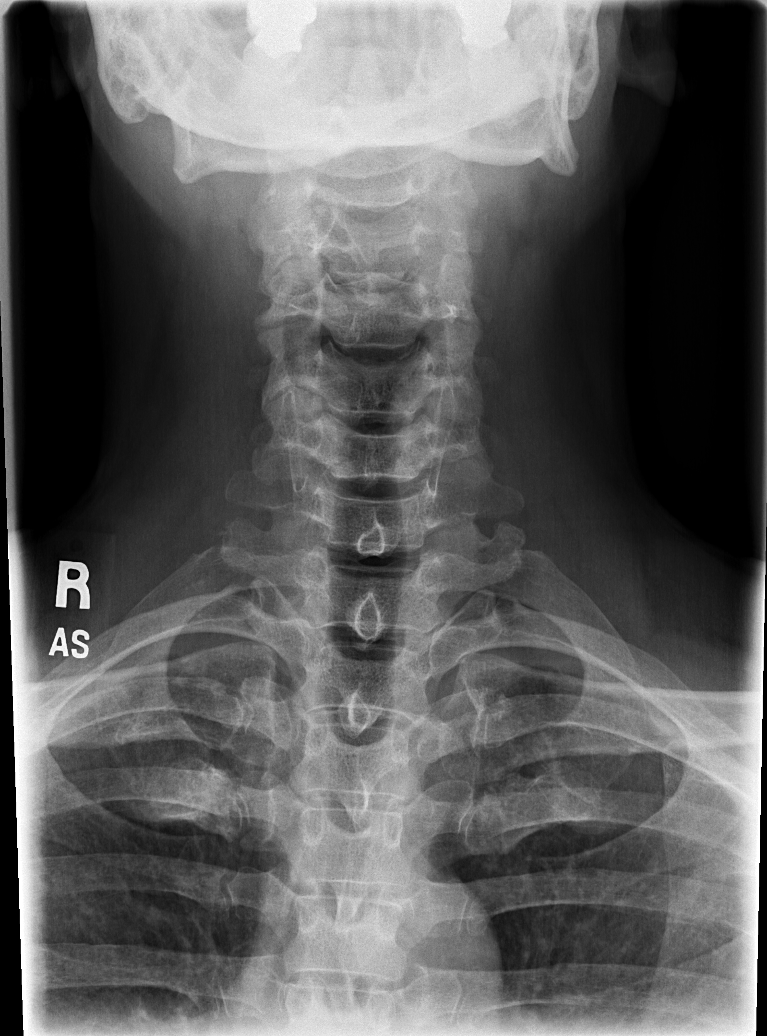

[w c-spine odontoid *]
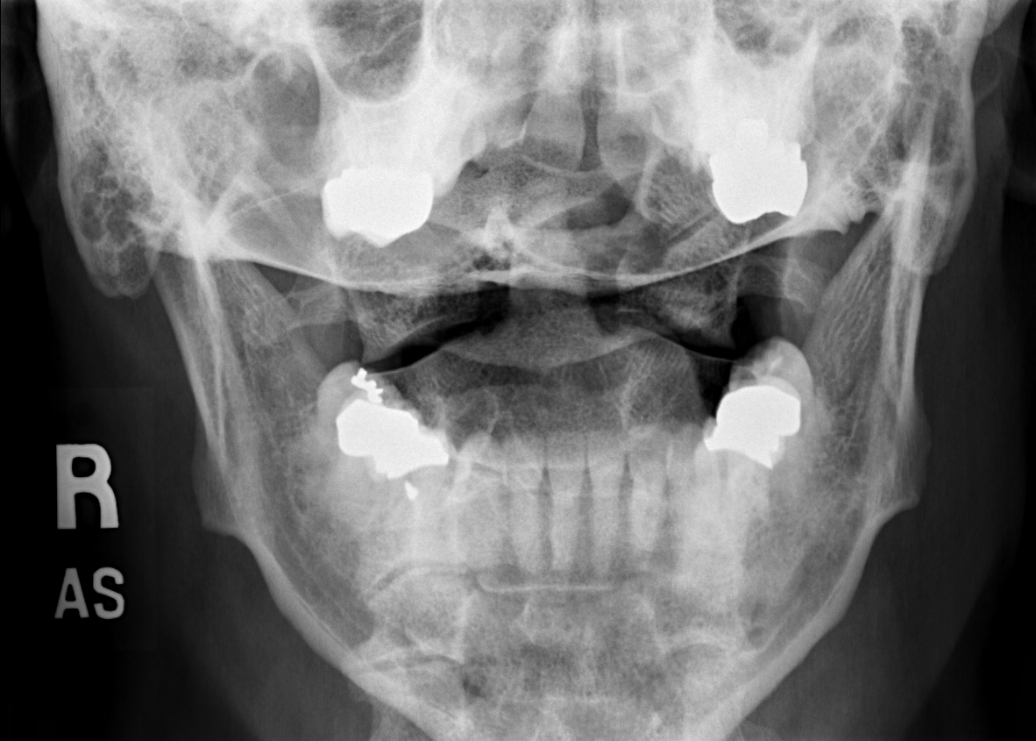

[5 of 5 positions shown; findings below may reference images not displayed]

FINDINGS: Frontal, lateral, open-mouth odontoid, and bilateral oblique views
were obtained. There is no evident acute fracture or
spondylolisthesis. There is remodeling in the C6 vertebral body
which may represent residua of old trauma. Prevertebral soft tissues
and predental space regions are normal. There is slight disc space
narrowing at C4-5 and C5-6. There is calcification in the anterior
ligament at C4-5, C5-6, and C6-7. There is slight facet hypertrophy
at C5-6 bilaterally. No erosive changes. Lung apices are clear.
IMPRESSION: No acute fracture or spondylolisthesis. Remodeling of the C6
vertebral body may represent residua of old trauma. Areas of
relatively mild osteoarthritic change at several levels.

## 2020-01-07 ENCOUNTER — Other Ambulatory Visit: Payer: Self-pay | Admitting: Medical

## 2020-01-16 ENCOUNTER — Other Ambulatory Visit: Payer: Self-pay

## 2020-02-21 ENCOUNTER — Telehealth: Payer: Self-pay | Admitting: Neurology

## 2020-02-21 NOTE — Telephone Encounter (Signed)
LMOVM 12:35pm

## 2020-02-21 NOTE — Telephone Encounter (Signed)
Patient sent the following correspondence through MyChart. Routing to triage to assist patient with request, advise on scheduling.  Appointment Request From: Joshua Torres  With Provider: Glendale Chard, DO [facility] [place]]  Preferred Date Range: Any  Preferred Times: Any Time  Reason for visit: Request an Appointment  Comments: pain in the feet

## 2020-02-21 NOTE — Telephone Encounter (Signed)
Please contact pt and let him know Dr. Allena Katz out of office until end of July but that I did not that in the past in her note she recommended that pt see podiatry for foot pain.  Has he done that?

## 2020-02-26 ENCOUNTER — Telehealth: Payer: Self-pay

## 2020-02-26 ENCOUNTER — Other Ambulatory Visit: Payer: Self-pay

## 2020-02-26 DIAGNOSIS — M4306 Spondylolysis, lumbar region: Secondary | ICD-10-CM

## 2020-02-26 NOTE — Telephone Encounter (Signed)
Ordered mri, left message on patients voicemail.

## 2020-02-26 NOTE — Telephone Encounter (Signed)
Per Dr. 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  In the past, he has responded well to physical therapy              - Continue gabapentin 200mg  twice daily              - Continue home exercises              - MRI lumbar spine, if symptoms get worse  Pls order MRI lumbar spine without contrast, Dx: lumbosacral spondylosis, distal paresthesias. Thanks

## 2020-02-26 NOTE — Telephone Encounter (Signed)
Patient called to follow up. He said he'd like to proceed with having the MRI that Dr. Allena Katz recommended for foot pain.

## 2020-02-26 NOTE — Telephone Encounter (Signed)
See other phone note

## 2020-02-26 NOTE — Telephone Encounter (Signed)
Pt returned the office call. Pt states he wanted to have the MRI he was supposed to have the last time he was seen by Allena Katz. Please advise.

## 2020-03-26 ENCOUNTER — Telehealth: Payer: Self-pay | Admitting: Neurology

## 2020-03-26 NOTE — Telephone Encounter (Signed)
Patient called and said he has an MRI scheduled on Friday, 03/29/20. He said, "I have a bad back and I'm a little nervous. I'd like to know if the doctor will prescribe me something to help me relax for the test.  [facility] [address]

## 2020-03-27 MED ORDER — DIAZEPAM 5 MG PO TABS: ORAL_TABLET | ORAL | 0 refills | Status: DC

## 2020-03-27 NOTE — Addendum Note (Signed)
Addended by: Kathie Dike A on: 03/27/2020 10:56 AM   Modules accepted: Orders

## 2020-03-27 NOTE — Telephone Encounter (Signed)
Ok to give Valium 5mg  30 mins prior to MRI, may take second dose if needed. #2, no refills. She should have a driver if she takes the medication. Thanks

## 2020-03-27 NOTE — Telephone Encounter (Signed)
Spoke with pt and he states he would like the prescription for valium as recommended by Dr Karel Jarvis, he verbalized that he will have someone driving him, can take one tablet 30 min prior to MRI and an additional tablet if needed to complete MRI, he verbalized understanding of instructions.

## 2020-03-30 ENCOUNTER — Ambulatory Visit
Admission: RE | Admit: 2020-03-30 | Discharge: 2020-03-30 | Disposition: A | Discharge status: Home / Self Care | Payer: BC Managed Care – PPO | Source: Ambulatory Visit | Attending: Neurology | Admitting: Neurology

## 2020-03-30 ENCOUNTER — Other Ambulatory Visit: Payer: Self-pay

## 2020-03-30 DIAGNOSIS — M4306 Spondylolysis, lumbar region: Secondary | ICD-10-CM

## 2020-05-16 ENCOUNTER — Ambulatory Visit: Payer: BC Managed Care – PPO | Admitting: Medical

## 2020-05-16 ENCOUNTER — Other Ambulatory Visit: Payer: Self-pay

## 2020-05-16 ENCOUNTER — Encounter: Payer: Self-pay | Admitting: Medical

## 2020-05-16 VITALS — BP 132/88 | HR 72 | Ht 73.0 in | Wt 213.6 lb

## 2020-05-16 DIAGNOSIS — Z23 Encounter for immunization: Secondary | ICD-10-CM

## 2020-05-16 DIAGNOSIS — Z87438 Personal history of other diseases of male genital organs: Secondary | ICD-10-CM | POA: Insufficient documentation

## 2020-05-16 DIAGNOSIS — M6289 Other specified disorders of muscle: Secondary | ICD-10-CM

## 2020-05-16 DIAGNOSIS — E559 Vitamin D deficiency, unspecified: Secondary | ICD-10-CM

## 2020-05-16 DIAGNOSIS — Z13220 Encounter for screening for lipoid disorders: Secondary | ICD-10-CM | POA: Insufficient documentation

## 2020-05-16 DIAGNOSIS — Z0000 Encounter for general adult medical examination without abnormal findings: Secondary | ICD-10-CM | POA: Diagnosis not present

## 2020-05-16 DIAGNOSIS — R7989 Other specified abnormal findings of blood chemistry: Secondary | ICD-10-CM

## 2020-05-16 DIAGNOSIS — K219 Gastro-esophageal reflux disease without esophagitis: Secondary | ICD-10-CM | POA: Insufficient documentation

## 2020-05-16 DIAGNOSIS — M47816 Spondylosis without myelopathy or radiculopathy, lumbar region: Secondary | ICD-10-CM | POA: Insufficient documentation

## 2020-05-16 NOTE — Progress Notes (Addendum)
Subjective:   HPI  Joshua Torres is a 59 y.o. male who presents for Chief Complaint  Patient presents with  . Annual Exam    Patient Care Team: Ling Flesch, Kermit Balo, PA-C as PCP - General (Family Medicine) Glendale Chard, DO as Consulting Physician (Neurology) Sees dentist Sees eye doctor Dr. Nita Sickle, neurology Dr. Charna Elizabeth, GI Dr. Rhoderick Moody, [facility]   Concerns: Seeing neurology about back and legs pains  Seeing urology for management of chronic prostatitis  Considering retiring in 2024  Reviewed their medical, surgical, family, social, medication, and allergy history and updated chart as appropriate.  Past Medical History:  Diagnosis Date  . GERD (gastroesophageal reflux disease)   . Osteoarthritis    left middle finger  . Prostatitis   . Wears glasses     Past Surgical History:  Procedure Laterality Date  . COLONOSCOPY  01/2017   Dr. Loreta Ave, prior 2011  . COLONOSCOPY  2018   polyps; Dr. Loreta Ave, repeat 5 years    Social History   Socioeconomic History  . Marital status: Widowed    Spouse name: Not on file  . Number of children: 2  . Years of education: 1  . Highest education level: Not on file  Occupational History  . Occupation: Air traffic controller: [employer]  Tobacco Use  . Smoking status: Never Smoker  . Smokeless tobacco: Never Used  Vaping Use  . Vaping Use: Never used  Substance and Sexual Activity  . Alcohol use: No    Alcohol/week: 0.0 standard drinks  . Drug use: No  . Sexual activity: Not on file  Other Topics Concern  . Not on file  Social History Narrative   Lives in a 2 story home.  Has 2 children, 1 grandchild. 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   Exercise - stretching, walking.  Attends church.  Education: college.  04/2020   Social Determinants of Health   Financial Resource Strain:   . Difficulty of Paying Living Expenses: Not on file  Food Insecurity:   . Worried About Programme researcher, broadcasting/film/video in the Last Year: Not  on file  . Ran Out of Food in the Last Year: Not on file  Transportation Needs:   . Lack of Transportation (Medical): Not on file  . Lack of Transportation (Non-Medical): Not on file  Physical Activity:   . Days of Exercise per Week: Not on file  . Minutes of Exercise per Session: Not on file  Stress:   . Feeling of Stress : Not on file  Social Connections:   . Frequency of Communication with Friends and Family: Not on file  . Frequency of Social Gatherings with Friends and Family: Not on file  . Attends Religious Services: Not on file  . Active Member of Clubs or Organizations: Not on file  . Attends Banker Meetings: Not on file  . Marital Status: Not on file  Intimate Partner Violence:   . Fear of Current or Ex-Partner: Not on file  . Emotionally Abused: Not on file  . Physically Abused: Not on file  . Sexually Abused: Not on file    Family History  Problem Relation Age of Onset  . Cancer Father        brain  . Stroke Father   . Diabetes Sister   . Diabetes Mother   . Other Brother        death related to a fall, head contusion  . Asthma  Sister   . Heart disease Neg Hx      Current Outpatient Medications:  .  cholecalciferol (VITAMIN D3) 25 MCG (1000 UNIT) tablet, TAKE 1 TABLET BY MOUTH EVERY DAY, Disp: 30 tablet, Rfl: 0 .  Cyanocobalamin (VITAMIN B12 PO), Take by mouth., Disp: , Rfl:  .  pravastatin (PRAVACHOL) 20 MG tablet, Take 1 tablet (20 mg total) by mouth every evening., Disp: 90 tablet, Rfl: 3  Allergies  Allergen Reactions  . Ciprofloxacin     Achilles tendon ache, finger aches     Review of Systems Constitutional: -fever, -chills, -sweats, -unexpected weight change, -decreased appetite, -fatigue Allergy: -sneezing, -itching, -congestion Dermatology: -changing moles, --rash, -lumps ENT: -runny nose, -ear pain, -sore throat, -hoarseness, -sinus pain, -teeth pain, - ringing in ears, -hearing loss, -nosebleeds Cardiology: -chest pain,  -palpitations, -swelling, -difficulty breathing when lying flat, -waking up short of breath Respiratory: -cough, -shortness of breath, -difficulty breathing with exercise or exertion, -wheezing, -coughing up blood Gastroenterology: -abdominal pain, -nausea, -vomiting, -diarrhea, -constipation, -blood in stool, -changes in bowel movement, -difficulty swallowing or eating Hematology: -bleeding, -bruising  Musculoskeletal: -joint aches, -muscle aches, -joint swelling, +back pain, -neck pain, -cramping, -changes in gait Ophthalmology: denies vision changes, eye redness, itching, discharge Urology: -burning with urination, -difficulty urinating, -blood in urine, -urinary frequency, -urgency, -incontinence Neurology: -headache, -weakness, +tingling, +leg numbness, -memory loss, -falls, -dizziness Psychology: -depressed mood, -agitation, -sleep problems Male GU: no testicular mass, pain, no lymph nodes swollen, no swelling, no rash.     Objective:  BP 132/88   Pulse 72   Ht 6\' 1"  (1.854 m)   Wt 213 lb 9.6 oz (96.9 kg)   SpO2 97%   BMI 28.18 kg/m   General appearance: alert, no distress, WD/WN, African American male Skin: no worrisome lesions Neck: supple, no lymphadenopathy, no thyromegaly, no masses, normal ROM, no bruits Chest: non tender, normal shape and expansion Heart: RRR, normal S1, S2, no murmurs Lungs: CTA bilaterally, no wheezes, rhonchi, or rales Abdomen: +bs, soft, non tender, non distended, no masses, no hepatomegaly, no splenomegaly, no bruits Back: non tender, normal ROM, no scoliosis Musculoskeletal: upper extremities non tender, no obvious deformity, normal ROM throughout, lower extremities non tender, no obvious deformity, normal ROM throughout Extremities: no edema, no cyanosis, no clubbing Pulses: 2+ symmetric, upper and lower extremities, normal cap refill Neurological: alert, oriented x 3, CN2-12 intact, strength normal upper extremities and lower extremities,  sensation normal throughout, DTRs 2+ throughout, no cerebellar signs, gait normal Psychiatric: normal affect, behavior normal, pleasant  GU/rectal - deferred to urology    Assessment and Plan :   Encounter Diagnoses  Name Primary?  . Encounter for health maintenance examination in adult Yes  . Need for influenza vaccination   . Low serum vitamin D   . Vitamin D deficiency   . Gastroesophageal reflux disease, unspecified whether esophagitis present   . Screening for lipid disorders   . Lumbar spondylosis   . History of prostatitis   . Pelvic floor dysfunction     Physical exam - discussed and counseled on healthy lifestyle, diet, exercise, preventative care, vaccinations, sick and well care, proper use of emergency dept and after hours care, and addressed their concerns.    Health screening: See your eye doctor yearly for routine vision care. See your dentist yearly for routine dental care including hygiene visits twice yearly.  Cancer screening Colonoscopy:  Reviewed colonoscopy on file that is up to date  Discussed PSA, prostate exam, and prostate cancer  screening risks/benefits.  PSA lab reviewed from urology from 2020   Vaccinations: Advised yearly influenza vaccine Counseled on the influenza virus vaccine.  Vaccine information sheet given.  Influenza vaccine given after consent obtained.  Up to date on Shingrix and Td  Up to date on covid vaccine.  He will get [name] copy of covid card    Separate significant chronic issues discussed: Pelvic floor dysfunction, hx/o chronic prostatitis - managed by urology  Lumbar spondylolysis - managed by neurology  Vit d deficiency - c/t supplement, lab today   Imanol was seen today for annual exam.  Diagnoses and all orders for this visit:  Encounter for health maintenance examination in adult -     Comprehensive metabolic panel -     CBC with Differential/Platelet -     Lipid panel -     VITAMIN D 25 Hydroxy (Vit-D  Deficiency, Fractures)  Need for influenza vaccination  Low serum vitamin D  Vitamin D deficiency -     VITAMIN D 25 Hydroxy (Vit-D Deficiency, Fractures)  Gastroesophageal reflux disease, unspecified whether esophagitis present  Screening for lipid disorders -     Lipid panel  Lumbar spondylosis  History of prostatitis  Pelvic floor dysfunction  Other orders -     Flu Vaccine QUAD 6+ mos PF IM (Fluarix Quad PF)    Follow-up pending labs, yearly for physical

## 2020-05-16 NOTE — Addendum Note (Signed)
Addended by: Victorio Palm on: 05/16/2020 10:12 AM   Modules accepted: Orders

## 2020-05-17 ENCOUNTER — Other Ambulatory Visit: Payer: Self-pay | Admitting: Medical

## 2020-05-17 LAB — CBC WITH DIFFERENTIAL/PLATELET
Basophils Absolute: 0.1 10*3/uL (ref 0.0–0.2)
Basos: 2 %
EOS (ABSOLUTE): 0.1 10*3/uL (ref 0.0–0.4)
Eos: 2 %
Hematocrit: 46.2 % (ref 37.5–51.0)
Hemoglobin: 15.6 g/dL (ref 13.0–17.7)
Immature Grans (Abs): 0 10*3/uL (ref 0.0–0.1)
Immature Granulocytes: 0 %
Lymphocytes Absolute: 2.1 10*3/uL (ref 0.7–3.1)
Lymphs: 50 %
MCH: 29.3 pg (ref 26.6–33.0)
MCHC: 33.8 g/dL (ref 31.5–35.7)
MCV: 87 fL (ref 79–97)
Monocytes Absolute: 0.5 10*3/uL (ref 0.1–0.9)
Monocytes: 12 %
Neutrophils Absolute: 1.5 10*3/uL (ref 1.4–7.0)
Neutrophils: 34 %
Platelets: 160 10*3/uL (ref 150–450)
RBC: 5.33 x10E6/uL (ref 4.14–5.80)
RDW: 12.9 % (ref 11.6–15.4)
WBC: 4.3 10*3/uL (ref 3.4–10.8)

## 2020-05-17 LAB — COMPREHENSIVE METABOLIC PANEL
ALT: 29 IU/L (ref 0–44)
AST: 26 IU/L (ref 0–40)
Albumin/Globulin Ratio: 1.7 (ref 1.2–2.2)
Albumin: 4.3 g/dL (ref 3.8–4.9)
Alkaline Phosphatase: 105 IU/L (ref 48–121)
BUN/Creatinine Ratio: 17 (ref 10–24)
BUN: 17 mg/dL (ref 8–27)
Bilirubin Total: 1 mg/dL (ref 0.0–1.2)
CO2: 22 mmol/L (ref 20–29)
Calcium: 9.2 mg/dL (ref 8.6–10.2)
Chloride: 105 mmol/L (ref 96–106)
Creatinine, Ser: 1 mg/dL (ref 0.76–1.27)
GFR calc Af Amer: 94 mL/min/{1.73_m2} (ref >59)
GFR calc non Af Amer: 81 mL/min/{1.73_m2} (ref >59)
Globulin, Total: 2.6 g/dL (ref 1.5–4.5)
Glucose: 94 mg/dL (ref 65–99)
Potassium: 3.9 mmol/L (ref 3.5–5.2)
Sodium: 140 mmol/L (ref 134–144)
Total Protein: 6.9 g/dL (ref 6.0–8.5)

## 2020-05-17 LAB — LIPID PANEL
Chol/HDL Ratio: 3.9 ratio (ref 0.0–5.0)
Cholesterol, Total: 227 mg/dL — ABNORMAL HIGH (ref 100–199)
HDL: 58 mg/dL (ref >39)
LDL Chol Calc (NIH): 152 mg/dL — ABNORMAL HIGH (ref 0–99)
Triglycerides: 98 mg/dL (ref 0–149)
VLDL Cholesterol Cal: 17 mg/dL (ref 5–40)

## 2020-05-17 LAB — VITAMIN D 25 HYDROXY (VIT D DEFICIENCY, FRACTURES): Vit D, 25-Hydroxy: 32.8 ng/mL (ref 30.0–100.0)

## 2020-05-17 MED ORDER — VITAMIN D 25 MCG (1000 UNIT) PO TABS: 1000.0000 [IU] | ORAL_TABLET | Freq: Every day | ORAL | 3 refills | Status: DC

## 2020-05-21 ENCOUNTER — Other Ambulatory Visit: Payer: Self-pay | Admitting: Medical

## 2020-05-21 MED ORDER — PRAVASTATIN SODIUM 20 MG PO TABS: 20.0000 mg | ORAL_TABLET | Freq: Every evening | ORAL | 3 refills | Status: DC

## 2020-06-19 ENCOUNTER — Encounter: Payer: Self-pay | Admitting: Medical

## 2020-07-08 ENCOUNTER — Ambulatory Visit: Payer: BC Managed Care – PPO | Admitting: Neurology

## 2020-08-05 ENCOUNTER — Other Ambulatory Visit: Payer: Self-pay

## 2020-08-05 ENCOUNTER — Encounter: Payer: Self-pay | Admitting: Neurology

## 2020-08-05 ENCOUNTER — Ambulatory Visit (INDEPENDENT_AMBULATORY_CARE_PROVIDER_SITE_OTHER): Payer: BC Managed Care – PPO | Admitting: Neurology

## 2020-08-05 VITALS — BP 137/90 | HR 96 | Ht 73.0 in | Wt 218.0 lb

## 2020-08-05 DIAGNOSIS — M4306 Spondylolysis, lumbar region: Secondary | ICD-10-CM | POA: Diagnosis not present

## 2020-08-05 DIAGNOSIS — R200 Anesthesia of skin: Secondary | ICD-10-CM | POA: Diagnosis not present

## 2020-08-05 DIAGNOSIS — R202 Paresthesia of skin: Secondary | ICD-10-CM

## 2020-08-05 MED ORDER — NORTRIPTYLINE HCL 10 MG PO CAPS: ORAL_CAPSULE | ORAL | 3 refills | Status: DC

## 2020-08-05 NOTE — Patient Instructions (Addendum)
Stop gabapentin  Start nortriptyline 10mg  at bedtime for 2 week, then increase to 2 tablet at bedtime  Nerve testing of the legs.  Please do not apply lotion or oil on your feet or legs on the day of testing  ELECTROMYOGRAM AND NERVE CONDUCTION STUDIES (EMG/NCS) INSTRUCTIONS  How to Prepare The neurologist conducting the EMG will need to know if you have certain medical conditions. Tell the neurologist and other EMG lab personnel if you: . Have a pacemaker or any other electrical medical device . Take blood-thinning medications . Have hemophilia, a blood-clotting disorder that causes prolonged bleeding Bathing Take a shower or bath shortly before your exam in order to remove oils from your skin. Don't apply lotions or creams before the exam.  What to Expect You'll likely be asked to change into a hospital gown for the procedure and lie down on an examination table. The following explanations can help you understand what will happen during the exam.  . Electrodes. The neurologist or a technician places surface electrodes at various locations on your skin depending on where you're experiencing symptoms. Or the neurologist may insert needle electrodes at different sites depending on your symptoms.  . Sensations. The electrodes will at times transmit a tiny electrical current that you may feel as a twinge or spasm. The needle electrode may cause discomfort or pain that usually ends shortly after the needle is removed. If you are concerned about discomfort or pain, you may want to talk to the neurologist about taking a short break during the exam.  . Instructions. During the needle EMG, the neurologist will assess whether there is any spontaneous electrical activity when the muscle is at rest - activity that isn't present in healthy muscle tissue - and the degree of activity when you slightly contract the muscle.  He or she will give you instructions on resting and contracting a muscle at appropriate  times. Depending on what muscles and nerves the neurologist is examining, he or she may ask you to change positions during the exam.  After your EMG You may experience some temporary, minor bruising where the needle electrode was inserted into your muscle. This bruising should fade within several days. If it persists, contact your primary care doctor.

## 2020-08-05 NOTE — Progress Notes (Signed)
Follow-up Visit   Date: 08/05/20    Joshua Torres MRN: [id number] DOB: November 10, 1960   Interim History: Joshua Torres is a 59 y.o. right-handed African American male returning to the clinic for follow-up of bilateral feet paresthesias.  The patient was accompanied to the clinic by self.  History of present illness: He was getting doxycycline for prostatitis in March and after three weeks of antibiotics treatment, he began noticing burning sensation over the dorsum of the feet and over the past few weeks, it has involved the soles of the feet.  Symptoms can vary in duration and location, but tends to involve the right foot more.  He has noticed exercise improves the discomfort, and it is worse with prolonged sitting and walking, which can involve his thighs also. He denies weakness or imbalance.  He has long history of chronic low back pain and was treated with ESI 20+ years ago.  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    He wife of 31 years passed away very recently on 16-Jun-2018 from liver cancer and he is grieving her loss.  He does reports having good social support. With respect to his feet paresthesias, he had marked response to PT and when he was regularly attending PT, pain was minimal.   UPDATE 08/05/2020:  He is here for follow-up visit.  He continues to have bilateral feet tingling and pain, which is at the balls of the feet. Symptoms are worse with prolonged sitting and improved with laying down.  No weakness or imbalance.  He still has low back pain.  He completed physical therapy which has variable benefit.   MRI lumbar spine shows multilevel degenerative changes,no specific area of nerve impingement.   Medications:  Current Outpatient Medications on File Prior to Visit  Medication Sig Dispense Refill  . cholecalciferol (VITAMIN D3) 25 MCG (1000 UNIT) tablet Take 1 tablet (1,000 Units total) by mouth daily. 90 tablet 3  . Cyanocobalamin (VITAMIN B12 PO) Take by  mouth.    . pravastatin (PRAVACHOL) 20 MG tablet Take 1 tablet (20 mg total) by mouth every evening. 90 tablet 3   No current facility-administered medications on file prior to visit.    Allergies:  Allergies  Allergen Reactions  . Ciprofloxacin     Achilles tendon ache, finger aches     Vital Signs:  BP 137/90   Pulse 96   Ht 6\' 1"  (1.854 m)   Wt 218 lb (98.9 kg)   SpO2 97%   BMI 28.76 kg/m   Neurological Exam: MENTAL STATUS including orientation to time, place, person, recent and remote memory, attention span and concentration, language, and fund of knowledge is normal.  Speech is not dysarthric.  CRANIAL NERVES:  Normal conjugate, extra-ocular eye movements in all directions of gaze.  No ptosis. Normal facial sensation.  Face is symmetric.   MOTOR:  Motor strength is 5/5 in all extremities.  No pronator drift.  Tone is normal.    MSRs:  Right                                                                 Left brachioradialis 2+  brachioradialis 2+  biceps 2+  biceps 2+  triceps 2+  triceps  2+  patellar 2+  patellar 2+  ankle jerk 2+  ankle jerk 2+   SENSORY:  Intact to vibration and pin prick throughout.  COORDINATION/GAIT:  Normal finger-to- nose-finger and heel-to-shin.  Intact rapid alternating movements bilaterally.Gait is normal.  Stressed gait intact.   Data: n/a  IMPRESSION/PLAN: 1.  Bilateral feet paresthesias  - NCS/EMG of the legs  - Start nortriptyline 10mg  at bedtime for 2 week, then increase to 2 tablet at bedtime  2.  Left arm and leg numbness - resolved.  CT head negative.  3.  Lumbosacral spondylosis, stable.  No nerve impingement on MRI  - Continue home stretching exercises  - D/C gabapentin due to cognitive side effects   Further recommendations pending results.    Thank you for allowing me to participate in patient's care.  If I can answer any additional questions, I would be pleased to do so.    Sincerely,    Tabrina Esty K. ,  DO

## 2020-08-27 ENCOUNTER — Other Ambulatory Visit: Payer: Self-pay | Admitting: Neurology

## 2020-09-24 ENCOUNTER — Ambulatory Visit (INDEPENDENT_AMBULATORY_CARE_PROVIDER_SITE_OTHER): Payer: BC Managed Care – PPO | Admitting: Neurology

## 2020-09-24 ENCOUNTER — Other Ambulatory Visit: Payer: Self-pay

## 2020-09-24 DIAGNOSIS — M4306 Spondylolysis, lumbar region: Secondary | ICD-10-CM | POA: Diagnosis not present

## 2020-09-24 DIAGNOSIS — R202 Paresthesia of skin: Secondary | ICD-10-CM

## 2020-09-24 NOTE — Procedures (Signed)
[facility]  [address] [place], [address]  [place], [place] [address] Tel: [phone] Fax:  [phone] Test Date:  09/24/2020  Patient: Joshua Torres DOB: 06/19/1961 Physician: Nita Sickle, DO  Sex: Male Height: 6\' 1"  Ref Phys: , DO  ID#: Nita Sickle   Technician:    Patient Complaints: This is a 60 year old man referred for evaluation of bilateral feet paresthesias  NCV & EMG Findings: Electrodiagnostic testing of the right lower extremity and additional studies of the left shows: 1. Bilateral sural and superficial peroneal sensory responses are within normal limits. 2. Bilateral peroneal and tibial motor responses are within normal limits. 3. Bilateral tibial H reflex studies are within normal limits. 4. There is no evidence of active or chronic motor axonal changes affecting any of the tested muscles.  Motor unit configuration and recruitment pattern is within normal limits.   Impression: This is a normal study of the lower extremities.  In particular, there is no evidence of a sensorimotor polyneuropathy or lumbosacral radiculopathy.   ___________________________ 67, DO    Nerve Conduction Studies Anti Sensory Summary Table   Stim Site NR Peak (ms) Norm Peak (ms) P-T Amp (V) Norm P-T Amp  Left Sup Peroneal Anti Sensory (Ant Lat Mall)  32C  12 cm    3.3 <4.6 8.8 >3  Right Sup Peroneal Anti Sensory (Ant Lat Mall)  32C  12 cm    3.4 <4.6 10.5 >3  Left Sural Anti Sensory (Lat Mall)  32C  Calf    4.0 <4.6 27.9 >3  Right Sural Anti Sensory (Lat Mall)  32C  Calf    3.2 <4.6 33.2 >3   Motor Summary Table   Stim Site NR Onset (ms) Norm Onset (ms) O-P Amp (mV) Norm O-P Amp Site1 Site2 Delta-0 (ms) Dist (cm) Vel (m/s) Norm Vel (m/s)  Left Peroneal Motor (Ext Dig Brev)  32C  Ankle    4.0 <6.0 7.3 >2.5 B Fib Ankle 9.4 43.0 46 >40  B Fib    13.4  6.2  Poplt B Fib 1.8 8.0 44 >40  Poplt    15.2  6.1         Right Peroneal Motor (Ext Dig  Brev)  32C  Ankle    4.7 <6.0 7.0 >2.5 B Fib Ankle 8.9 40.0 45 >40  B Fib    13.6  6.3  Poplt B Fib 1.4 8.0 57 >40  Poplt    15.0  6.0         Left Tibial Motor (Abd Hall Brev)  32C  Ankle    5.6 <6.0 6.0 >4 Knee Ankle 11.0 46.0 42 >40  Knee    16.6  5.1         Right Tibial Motor (Abd Hall Brev)  32C  Ankle    4.1 <6.0 6.5 >4 Knee Ankle 10.5 47.0 45 >40  Knee    14.6  6.1          H Reflex Studies   NR H-Lat (ms) Lat Norm (ms) L-R H-Lat (ms)  Left Tibial (Gastroc)  32C     34.69 <35 1.50  Right Tibial (Gastroc)  32C     33.20 <35 1.50   EMG   Side Muscle Ins Act Fibs Psw Fasc Number Recrt Dur Dur. Amp Amp. Poly Poly. Comment  Right AntTibialis Nml Nml Nml Nml Nml Nml Nml Nml Nml Nml Nml Nml N/A  Right Gastroc Nml Nml Nml Nml Nml Nml Nml Nml  Nml Nml Nml Nml N/A  Right Flex Dig Long Nml Nml Nml Nml Nml Nml Nml Nml Nml Nml Nml Nml N/A  Right RectFemoris Nml Nml Nml Nml Nml Nml Nml Nml Nml Nml Nml Nml N/A  Right GluteusMed Nml Nml Nml Nml Nml Nml Nml Nml Nml Nml Nml Nml N/A  Left AntTibialis Nml Nml Nml Nml Nml Nml Nml Nml Nml Nml Nml Nml N/A  Left Gastroc Nml Nml Nml Nml Nml Nml Nml Nml Nml Nml Nml Nml N/A  Left Flex Dig Long Nml Nml Nml Nml Nml Nml Nml Nml Nml Nml Nml Nml N/A  Left RectFemoris Nml Nml Nml Nml Nml Nml Nml Nml Nml Nml Nml Nml N/A  Left GluteusMed Nml Nml Nml Nml Nml Nml Nml Nml Nml Nml Nml Nml N/A      Waveforms:

## 2020-09-24 NOTE — Progress Notes (Signed)
    Follow-up Visit   Date: 09/24/20    Joshua Torres MRN: [id number] DOB: [birth year]   Interim History: Joshua Torres is a 60 y.o. right-handed African American male returning to the clinic for follow-up of bilateral feet paresthesias.  The patient was accompanied to the clinic by self.  History of present illness: He was getting doxycycline for prostatitis in March and after three weeks of antibiotics treatment, he began noticing burning sensation over the dorsum of the feet and over the past few weeks, it has involved the soles of the feet.  Symptoms can vary in duration and location, but tends to involve the right foot more.  He has noticed exercise improves the discomfort, and it is worse with prolonged sitting and walking, which can involve his thighs also. He denies weakness or imbalance.  He has long history of chronic low back pain and was treated with ESI 20+ years ago.  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    He wife of 31 years passed away on May 21, 2018 from liver cancer and he is grieving her loss.  He does reports having good social support. With respect to his feet paresthesias, he had marked response to PT and when he was regularly attending PT, pain was minimal.   MRI lumbar spine shows multilevel degenerative changes,no specific area of nerve impingement.   UPDATE 09/24/2020:  He is here for EDX of the legs and discuss results. He continues to have tingling in the feet especially with prolonged sitting and walking. No weakness, imbalance, or falls.  MRI lumbar spine did not show nerve impingement and NCS/EMG is normal.   Medications:  Current Outpatient Medications on File Prior to Visit  Medication Sig Dispense Refill  . cholecalciferol (VITAMIN D3) 25 MCG (1000 UNIT) tablet Take 1 tablet (1,000 Units total) by mouth daily. 90 tablet 3  . Cyanocobalamin (VITAMIN B12 PO) Take by mouth.    . nortriptyline (PAMELOR) 10 MG capsule Start nortriptyline 10mg  at  bedtime for 2 week, then increase to 2 tablet at bedtime 60 capsule 3  . pravastatin (PRAVACHOL) 20 MG tablet Take 1 tablet (20 mg total) by mouth every evening. 90 tablet 3   No current facility-administered medications on file prior to visit.    Allergies:  Allergies  Allergen Reactions  . Ciprofloxacin     Achilles tendon ache, finger aches     Vital Signs:  There were no vitals taken for this visit.  Neurological Exam: Deferred  Data: n/a  IMPRESSION/PLAN: NCS/EMG of the legs 09/24/2020:  Normal  IMPRESSION/PLAN: Bilateral feet paresthesias, not classic for vascular claudication, however, with his negative neurological work-up (MRI lumbar spine and NCS/EMG), I think it is reasonable to screen him with bilateral ABI. Continue nortriptyline 20mg  at bedtime Reassurance provided that there is no neuropathy    Thank you for allowing me to participate in patient's care.  If I can answer any additional questions, I would be pleased to do so.    Sincerely,    Liridona Mashaw K. 11/22/2020, DO

## 2020-09-30 ENCOUNTER — Ambulatory Visit: Payer: BC Managed Care – PPO | Admitting: Neurology

## 2020-09-30 ENCOUNTER — Other Ambulatory Visit: Payer: Self-pay | Admitting: Neurology

## 2020-10-03 ENCOUNTER — Other Ambulatory Visit: Payer: BC Managed Care – PPO

## 2020-10-14 ENCOUNTER — Telehealth: Payer: Self-pay

## 2020-10-14 ENCOUNTER — Ambulatory Visit
Admission: RE | Admit: 2020-10-14 | Discharge: 2020-10-14 | Disposition: A | Discharge status: Home / Self Care | Payer: BC Managed Care – PPO | Source: Ambulatory Visit | Attending: Neurology | Admitting: Neurology

## 2020-10-14 DIAGNOSIS — M4306 Spondylolysis, lumbar region: Secondary | ICD-10-CM

## 2020-10-14 DIAGNOSIS — R202 Paresthesia of skin: Secondary | ICD-10-CM

## 2020-10-14 NOTE — Telephone Encounter (Signed)
Called patient and informed him of results. No questions or concerns.

## 2020-10-14 NOTE — Telephone Encounter (Signed)
-----   Message from Glendale Chard, DO sent at 10/14/2020  3:04 PM EST ----- Please inform pt that the blood flow/circulation to his legs is normal.

## 2020-10-31 ENCOUNTER — Telehealth: Payer: Self-pay

## 2020-10-31 NOTE — Telephone Encounter (Signed)
Pt states he went to Neurologist and she has "went as far as she could go" and wanted to know what is next recommendations?   Still having same issues

## 2020-11-01 NOTE — Telephone Encounter (Signed)
Schedule f/u and lets discuss

## 2020-11-04 NOTE — Telephone Encounter (Signed)
Left message to schedule appt

## 2020-11-11 ENCOUNTER — Ambulatory Visit: Payer: BC Managed Care – PPO | Admitting: Medical

## 2020-11-11 ENCOUNTER — Other Ambulatory Visit: Payer: Self-pay

## 2020-11-11 ENCOUNTER — Encounter: Payer: Self-pay | Admitting: Medical

## 2020-11-11 VITALS — BP 130/82 | HR 89 | Ht 73.0 in | Wt 219.4 lb

## 2020-11-11 DIAGNOSIS — M5136 Other intervertebral disc degeneration, lumbar region: Secondary | ICD-10-CM | POA: Insufficient documentation

## 2020-11-11 DIAGNOSIS — M7741 Metatarsalgia, right foot: Secondary | ICD-10-CM

## 2020-11-11 DIAGNOSIS — M7742 Metatarsalgia, left foot: Secondary | ICD-10-CM

## 2020-11-11 DIAGNOSIS — E559 Vitamin D deficiency, unspecified: Secondary | ICD-10-CM | POA: Diagnosis not present

## 2020-11-11 DIAGNOSIS — M5126 Other intervertebral disc displacement, lumbar region: Secondary | ICD-10-CM | POA: Diagnosis not present

## 2020-11-11 DIAGNOSIS — L853 Xerosis cutis: Secondary | ICD-10-CM | POA: Diagnosis not present

## 2020-11-11 DIAGNOSIS — M79671 Pain in right foot: Secondary | ICD-10-CM | POA: Insufficient documentation

## 2020-11-11 DIAGNOSIS — M79672 Pain in left foot: Secondary | ICD-10-CM

## 2020-11-11 DIAGNOSIS — M65341 Trigger finger, right ring finger: Secondary | ICD-10-CM | POA: Insufficient documentation

## 2020-11-11 NOTE — Progress Notes (Signed)
Subjective:  Joshua Torres is a 60 y.o. male who presents for Chief Complaint  Patient presents with  . Referral    Due to ongoing foot pain       Here for recheck on foot and leg pain.  We discussed pains in his legs and feet last year including burning sensation of feet, tingling of the legs and feet, pain in both legs and feet.  Today he notes that recently the only pain he has had is ongoing pain in the balls of his feet but no other pains in the legs and no burning sensation now.  He saw neurology recently for evaluation.  Had normal nerve studies, normal ABIs, had a trial of nortriptyline which has not really helped at all.  The pain is mainly when he is sitting and walking.  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  This is occasional.  Not a big problem but wanted to mention it.  He has some dryness of the skin of his hands.  He uses lotion occasionally  No other aggravating or relieving factors.    No other c/o.  The following portions of the patient's history were reviewed and updated as appropriate: allergies, current medications, past family history, past medical history, past social history, past surgical history and problem list.  ROS Otherwise as in subjective above  Objective: BP 130/82   Pulse 89   Ht 6\' 1"  (1.854 m)   Wt 219 lb 6.4 oz (99.5 kg)   SpO2 95%   BMI 28.95 kg/m   General appearance: alert, no distress, well developed, well nourished, African American male Feet nontender, no obvious deformity, normal range of motion of toes and ankle, no swelling Legs neurovascularly intact There is some dryness of skin folds of fingers volar surface on both hands He seems to have a mild trigger action of the right ring finger and bony spur noted along the distal metacarpal of the fourth finger, right hand    Assessment: Encounter Diagnoses   Name Primary?  Foot pain, bilateral Yes  . Bulging of lumbar intervertebral disc   . Vitamin D deficiency   . Dry skin   . Metatarsalgia of both feet      Plan: We discussed his ongoing foot pains.  When I saw him for this last year he was also having burning sensation, some paresthesias, more generalized leg and feet pains.  Now he is only reporting pain in the balls of the feet or the MTPs and distal metatarsals.  I reviewed back over his MRI lumbar spine from July 2021 that was abnormal.  I reviewed his recent neurology work-up including normal EMG/NCS and normal ABIs.  They did a trial of nortriptyline but did not really help.  He wants a referral but he is not convinced he needs to see a podiatrist versus back surgeon.  I will send a consult request through [facility] regarding this issue to get some further help on what he should see a back surgeon for possible epidural steroid injection versus podiatry  Dry skin dermatitis-advised daily moisturizing lotion  Vitamin D deficiency-advise he continue daily supplement  Trigger finger-he will use nighttime splints for now but if not improving or if getting worse we will refer to hand surgeon   Joshua Torres was seen today for referral.  Diagnoses  and all orders for this visit:  Foot pain, bilateral  Bulging of lumbar intervertebral disc  Vitamin D deficiency  Dry skin  Metatarsalgia of both feet    Follow up: pending call back

## 2020-12-24 ENCOUNTER — Other Ambulatory Visit: Payer: Self-pay

## 2020-12-24 ENCOUNTER — Telehealth: Payer: Self-pay | Admitting: Medical

## 2020-12-24 ENCOUNTER — Encounter: Payer: Self-pay | Admitting: Medical

## 2020-12-24 DIAGNOSIS — M79671 Pain in right foot: Secondary | ICD-10-CM

## 2020-12-24 DIAGNOSIS — M79672 Pain in left foot: Secondary | ICD-10-CM

## 2020-12-24 DIAGNOSIS — M7741 Metatarsalgia, right foot: Secondary | ICD-10-CM

## 2020-12-24 NOTE — Telephone Encounter (Signed)
Vernona Rieger Please call him about his my chart message about jury duty letter.  Is he still working as Naval architect for [employer]?  The court is very strict on who can have exemption from jury duty. 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  We are working on podiatry referral  Vincenza Hews

## 2020-12-24 NOTE — Telephone Encounter (Signed)
Please go ahead and refer him to podiatry

## 2020-12-25 NOTE — Telephone Encounter (Signed)
Left message for pt

## 2020-12-26 NOTE — Telephone Encounter (Signed)
Pt called I informed him of the referral for his foot.  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  States he can not sit still for long periods of time, he said his foot goes numb and also his back hurts when sitting for any period of time, said he has 2 bulging discs in his back.  Said there is no way he could sit still in a chair for hours and requests to be excused from jury duty.

## 2020-12-26 NOTE — Telephone Encounter (Signed)
Misty Stanley - could you write a jury duty letter based on these concerns and his history of chronic back pain, chronic foot pain, paresthesias in feet, unable to sit for long periods

## 2020-12-27 NOTE — Telephone Encounter (Signed)
Letter typed

## 2021-01-13 ENCOUNTER — Ambulatory Visit: Payer: Self-pay

## 2021-01-13 ENCOUNTER — Ambulatory Visit (INDEPENDENT_AMBULATORY_CARE_PROVIDER_SITE_OTHER): Payer: BC Managed Care – PPO | Admitting: Podiatry

## 2021-01-13 ENCOUNTER — Other Ambulatory Visit: Payer: Self-pay

## 2021-01-13 DIAGNOSIS — M7742 Metatarsalgia, left foot: Secondary | ICD-10-CM | POA: Diagnosis not present

## 2021-01-13 DIAGNOSIS — M7741 Metatarsalgia, right foot: Secondary | ICD-10-CM | POA: Diagnosis not present

## 2021-01-13 DIAGNOSIS — M5417 Radiculopathy, lumbosacral region: Secondary | ICD-10-CM

## 2021-01-13 NOTE — Patient Instructions (Signed)
I recommend consultation with Dr Aileen Fass, MD at [facility] for the back pain

## 2021-01-18 ENCOUNTER — Encounter: Payer: Self-pay | Admitting: Podiatry

## 2021-01-18 NOTE — Progress Notes (Signed)
  Subjective:  Patient ID: Joshua Torres, male    DOB: [birth year],  MRN: [id number]  Chief Complaint  Patient presents with  . Foot Pain    Bilateral foot pain    60 y.o. male presents with the above complaint. History confirmed with patient.  Has a history of bulging disks in his back as a burning tingling pain and numbness that is worse in his toes and is in the foot  Objective:  Physical Exam: warm, good capillary refill, no trophic changes or ulcerative lesions and normal DP and PT pulses.  Paresthesias noted, no reproducible tarsal tunnel syndrome or Tinel's sign   Assessment:   1. Lumbosacral neuritis   2. Metatarsalgia of both feet      Plan:  Patient was evaluated and treated and all questions answered.  Suspect this is most likely due to his back.  I recommend he be seen by a neurosurgery specialist or pain management specialist for this.  No evidence of tarsal tunnel syndrome causing this  Return if symptoms worsen or fail to improve.

## 2021-06-09 ENCOUNTER — Ambulatory Visit (INDEPENDENT_AMBULATORY_CARE_PROVIDER_SITE_OTHER): Payer: BC Managed Care – PPO | Admitting: Medical

## 2021-06-09 ENCOUNTER — Other Ambulatory Visit: Payer: Self-pay

## 2021-06-09 ENCOUNTER — Encounter: Payer: Self-pay | Admitting: Medical

## 2021-06-09 VITALS — BP 122/82 | HR 65 | Ht 73.0 in | Wt 204.2 lb

## 2021-06-09 DIAGNOSIS — M65341 Trigger finger, right ring finger: Secondary | ICD-10-CM

## 2021-06-09 DIAGNOSIS — K219 Gastro-esophageal reflux disease without esophagitis: Secondary | ICD-10-CM

## 2021-06-09 DIAGNOSIS — E785 Hyperlipidemia, unspecified: Secondary | ICD-10-CM

## 2021-06-09 DIAGNOSIS — Z23 Encounter for immunization: Secondary | ICD-10-CM | POA: Diagnosis not present

## 2021-06-09 DIAGNOSIS — M4306 Spondylolysis, lumbar region: Secondary | ICD-10-CM

## 2021-06-09 DIAGNOSIS — N411 Chronic prostatitis: Secondary | ICD-10-CM

## 2021-06-09 DIAGNOSIS — E559 Vitamin D deficiency, unspecified: Secondary | ICD-10-CM

## 2021-06-09 DIAGNOSIS — Z0000 Encounter for general adult medical examination without abnormal findings: Secondary | ICD-10-CM | POA: Diagnosis not present

## 2021-06-09 DIAGNOSIS — Z87438 Personal history of other diseases of male genital organs: Secondary | ICD-10-CM

## 2021-06-09 DIAGNOSIS — Z7185 Encounter for immunization safety counseling: Secondary | ICD-10-CM

## 2021-06-09 DIAGNOSIS — R202 Paresthesia of skin: Secondary | ICD-10-CM

## 2021-06-09 NOTE — Patient Instructions (Addendum)
This visit was a preventative care visit, also known as wellness visit or routine physical.   Topics typically include healthy lifestyle, diet, exercise, preventative care, vaccinations, sick and well care, proper use of emergency dept and after hours care, as well as other concerns.     Recommendations: Continue to return yearly for your annual wellness and preventative care visits.  This gives [name] a chance to discuss healthy lifestyle, exercise, vaccinations, review your chart record, and perform screenings where appropriate.  I recommend you see your eye doctor yearly for routine vision care.  I recommend you see your dentist yearly for routine dental care including hygiene visits twice yearly.   Vaccination recommendations were reviewed Immunization History  Administered Date(s) Administered   Influenza,inj,Quad PF,6+ Mos 05/16/2019, 05/16/2020, 06/09/2021   Moderna Sars-Covid-2 Vaccination 01/26/2020, 02/23/2020   Td 04/29/2015   Zoster Recombinat (Shingrix) 06/10/2019, 08/08/2019   Counseled on the influenza virus vaccine.  Vaccine information sheet given.  Influenza vaccine given after consent obtained.  Consider covid booster   Screening for cancer: Colon cancer screening: I reviewed your colonoscopy on file that is up to date from 2018  We discussed PSA, prostate exam, and prostate cancer screening risks/benefits.     Skin cancer screening: Check your skin regularly for new changes, growing lesions, or other lesions of concern Come in for evaluation if you have skin lesions of concern.  Lung cancer screening: If you have a greater than 20 pack year history of tobacco use, then you may qualify for lung cancer screening with a chest CT scan.   Please call your insurance company to inquire about coverage for this test.  We currently don't have screenings for other cancers besides breast, cervical, colon, and lung cancers.  If you have a strong family history of cancer or  have other cancer screening concerns, please let me know.    Bone health: Get at least 150 minutes of aerobic exercise weekly Get weight bearing exercise at least once weekly Bone density test:  A bone density test is an imaging test that uses a type of X-ray to measure the amount of calcium and other minerals in your bones. The test may be used to diagnose or screen you for a condition that causes weak or thin bones (osteoporosis), predict your risk for a broken bone (fracture), or determine how well your osteoporosis treatment is working. The bone density test is recommended for females 65 and older, or females or males <65 if certain risk factors such as thyroid disease, long term use of steroids such as for asthma or rheumatological issues, vitamin D deficiency, estrogen deficiency, family history of osteoporosis, self or family history of fragility fracture in first degree relative.    Heart health: Get at least 150 minutes of aerobic exercise weekly Limit alcohol It is important to maintain a healthy blood pressure and healthy cholesterol numbers  Heart disease screening: Screening for heart disease includes screening for blood pressure, fasting lipids, glucose/diabetes screening, BMI height to weight ratio, reviewed of smoking status, physical activity, and diet.    Goals include blood pressure 120/80 or less, maintaining a healthy lipid/cholesterol profile, preventing diabetes or keeping diabetes numbers under good control, not smoking or using tobacco products, exercising most days per week or at least 150 minutes per week of exercise, and eating healthy variety of fruits and vegetables, healthy oils, and avoiding unhealthy food choices like fried food, fast food, high sugar and high cholesterol foods.    Other tests may  possibly include EKG test, CT coronary calcium score, echocardiogram, exercise treadmill stress test.    Medical care options: I recommend you continue to seek  care here first for routine care.  We try really hard to have available appointments Monday through Friday daytime hours for sick visits, acute visits, and physicals.  Urgent care should be used for after hours and weekends for significant issues that cannot wait till the next day.  The emergency department should be used for significant potentially life-threatening emergencies.  The emergency department is expensive, can often have long wait times for less significant concerns, so try to utilize primary care, urgent care, or telemedicine when possible to avoid unnecessary trips to the emergency department.  Virtual visits and telemedicine have been introduced since the pandemic started in 2020, and can be convenient ways to receive medical care.  We offer virtual appointments as well to assist you in a variety of options to seek medical care.   Advanced Directives: I recommend you consider completing a Health Care Power of Attorney and Living Will.   These documents respect your wishes and help alleviate burdens on your loved ones if you were to become terminally ill or be in a position to need those documents enforced.    You can complete Advanced Directives yourself, have them notarized, then have copies made for our office, for you and for anybody you feel should have them in safe keeping.  Or, you can have an attorney prepare these documents.   If you haven't updated your Last Will and Testament in a while, it may be worthwhile having an attorney prepare these documents together and save on some costs.        Specific concerns Hyperlipidemia - labs today, continue current medication  Vit D deficiency - continue supplement, lab today  Trigger finger - referral to hand surgery  Chronic back pain, spondylosis, paresthesias - prior consults with neurology and podiatry.  Referral to spine and scoliosis center

## 2021-06-09 NOTE — Progress Notes (Signed)
Subjective:   HPI  Joshua Torres is a 60 y.o. male who presents for Chief Complaint  Patient presents with   fasting cpe    Fasting cpe. Having back pain- and numbness in feet. Would like flu shot today    Patient Care Team: Myna Freimark, Kermit Balo, PA-C as PCP - General (Family Medicine) Glendale Chard, DO as Consulting Physician (Neurology) Sees dentist Sees eye doctor Dr. Earvin Hansen, [facility] Dr. Sharl Ma, Podiatry Dr. Charna Elizabeth, GI Dr. Nita Sickle, neurology  Concerns: Right trigger finger.  Ongoing, not necessarily worse but the more he does as far as activity during the day that worsens seem to lock up on him.  He is right-handed.  He has ongoing problems with his numbness in his feet and back pain and leg pain.  He saw podiatry in April.  He thought he was going to be referred to a back Careers adviser.  He has not heard any phone call back on this.  A friend of his recommended Dr. Sharolyn [facility].  He would like to be referred there  He has a history of prostatitis.  He saw urology in August.  They felt like he does not need to return unless he has problems.  He is doing fine in this regard.  Hyperlipidemia-compliant with statin without complaint  Vitamin D deficiency-compliant with vitamin D supplement    Reviewed their medical, surgical, family, social, medication, and allergy history and updated chart as appropriate.  Past Medical History:  Diagnosis Date   GERD (gastroesophageal reflux disease)    Osteoarthritis    left middle finger   Prostatitis    Wears glasses     Past Surgical History:  Procedure Laterality Date   COLONOSCOPY  01/2017   Dr. Loreta Ave, prior 2011   COLONOSCOPY  2018   polyps; Dr. Loreta Ave, repeat 5 years    Family History  Problem Relation Age of Onset   Cancer Father        brain   Stroke Father    Diabetes Sister    Diabetes Mother    Other Brother        death related to a fall, head contusion   Asthma  Sister    Heart disease Neg Hx      Current Outpatient Medications:    cholecalciferol (VITAMIN D3) 25 MCG (1000 UNIT) tablet, Take 1 tablet (1,000 Units total) by mouth daily., Disp: 90 tablet, Rfl: 3   Cyanocobalamin (VITAMIN B12 PO), Take by mouth., Disp: , Rfl:    pravastatin (PRAVACHOL) 20 MG tablet, Take 1 tablet (20 mg total) by mouth every evening., Disp: 90 tablet, Rfl: 3  Allergies  Allergen Reactions   Ciprofloxacin     Achilles tendon ache, finger aches     Review of Systems Constitutional: -fever, -chills, -sweats, -unexpected weight change, -decreased appetite, -fatigue Allergy: -sneezing, -itching, -congestion Dermatology: -changing moles, --rash, -lumps ENT: -runny nose, -ear pain, -sore throat, -hoarseness, -sinus pain, -teeth pain, - ringing in ears, -hearing loss, -nosebleeds Cardiology: -chest pain, -palpitations, -swelling, -difficulty breathing when lying flat, -waking up short of breath Respiratory: -cough, -shortness of breath, -difficulty breathing with exercise or exertion, -wheezing, -coughing up blood Gastroenterology: -abdominal pain, -nausea, -vomiting, -diarrhea, -constipation, -blood in stool, -changes in bowel movement, -difficulty swallowing or eating Hematology: -bleeding, -bruising  Musculoskeletal: -joint aches, -muscle aches, -joint swelling, -back pain, -neck pain, -cramping, -changes in gait Ophthalmology: denies vision changes, eye redness, itching, discharge Urology: -burning with urination, -difficulty  urinating, -blood in urine, -urinary frequency, -urgency, -incontinence Neurology: -headache, -weakness, -tingling, -numbness, -memory loss, -falls, -dizziness Psychology: -depressed mood, -agitation, -sleep problems Male GU: no testicular mass, pain, no lymph nodes swollen, no swelling, no rash.  Depression screen [facility] 2/9 06/09/2021 05/16/2020 05/16/2019  Decreased Interest 0 0 0  Down, Depressed, Hopeless 0 0 0  PHQ - 2 Score 0 0 0         Objective:  BP 122/82   Pulse 65   Ht 6\' 1"  (1.854 m)   Wt 204 lb 3.2 oz (92.6 kg)   BMI 26.94 kg/m   General appearance: alert, no distress, WD/WN, African American male Skin: unremarkable, no worrisome lesions HEENT: normocephalic, conjunctiva/corneas normal, sclerae anicteric, PERRLA, EOMi Neck: supple, no lymphadenopathy, no thyromegaly, no masses, normal ROM, no bruits Chest: non tender, normal shape and expansion Heart: RRR, normal S1, S2, no murmurs Lungs: CTA bilaterally, no wheezes, rhonchi, or rales Abdomen: +bs, soft, non tender, non distended, no masses, no hepatomegaly, no splenomegaly, no bruits Back: non tender, normal ROM, no scoliosis Musculoskeletal: Right ring finger with trigger motion but nontender.  Otherwise arms and hands nontender with normal range of motion.  Upper extremities non tender, no obvious deformity, normal ROM throughout, lower extremities non tender, no obvious deformity, normal ROM throughout Extremities: no edema, no cyanosis, no clubbing Pulses: 2+ symmetric, upper and lower extremities, normal cap refill Neurological: alert, oriented x 3, CN2-12 intact, strength normal upper extremities and lower extremities, sensation normal throughout, DTRs 2+ throughout, no cerebellar signs, gait normal Psychiatric: normal affect, behavior normal, pleasant  GU/rectal- deferred, just had exam 04/2021 with urology   Assessment and Plan :   Encounter Diagnoses  Name Primary?   Encounter for health maintenance examination in adult Yes   Needs flu shot    Chronic prostatitis    Vitamin D deficiency    Vaccine counseling    Need for influenza vaccination    History of prostatitis    Lumbar spondylolysis    Gastroesophageal reflux disease, unspecified whether esophagitis present    Hyperlipidemia, unspecified hyperlipidemia type    Trigger ring finger of right hand    Leg paresthesia     This visit was a preventative care visit, also known as  wellness visit or routine physical.   Topics typically include healthy lifestyle, diet, exercise, preventative care, vaccinations, sick and well care, proper use of emergency dept and after hours care, as well as other concerns.     Recommendations: Continue to return yearly for your annual wellness and preventative care visits.  This gives 05/2021 a chance to discuss healthy lifestyle, exercise, vaccinations, review your chart record, and perform screenings where appropriate.  I recommend you see your eye doctor yearly for routine vision care.  I recommend you see your dentist yearly for routine dental care including hygiene visits twice yearly.   Vaccination recommendations were reviewed Immunization History  Administered Date(s) Administered   Influenza,inj,Quad PF,6+ Mos 05/16/2019, 05/16/2020, 06/09/2021   Moderna Sars-Covid-2 Vaccination 01/26/2020, 02/23/2020   Td 04/29/2015   Zoster Recombinat (Shingrix) 06/10/2019, 08/08/2019   Counseled on the influenza virus vaccine.  Vaccine information sheet given.  Influenza vaccine given after consent obtained.  Consider covid booster   Screening for cancer: Colon cancer screening: I reviewed your colonoscopy on file that is up to date from 2018  We discussed PSA, prostate exam, and prostate cancer screening risks/benefits.     Skin cancer screening: Check your skin regularly for new changes,  growing lesions, or other lesions of concern Come in for evaluation if you have skin lesions of concern.  Lung cancer screening: If you have a greater than 20 pack year history of tobacco use, then you may qualify for lung cancer screening with a chest CT scan.   Please call your insurance company to inquire about coverage for this test.  We currently don't have screenings for other cancers besides breast, cervical, colon, and lung cancers.  If you have a strong family history of cancer or have other cancer screening concerns, please let me know.     Bone health: Get at least 150 minutes of aerobic exercise weekly Get weight bearing exercise at least once weekly Bone density test:  A bone density test is an imaging test that uses a type of X-ray to measure the amount of calcium and other minerals in your bones. The test may be used to diagnose or screen you for a condition that causes weak or thin bones (osteoporosis), predict your risk for a broken bone (fracture), or determine how well your osteoporosis treatment is working. The bone density test is recommended for females 65 and older, or females or males <65 if certain risk factors such as thyroid disease, long term use of steroids such as for asthma or rheumatological issues, vitamin D deficiency, estrogen deficiency, family history of osteoporosis, self or family history of fragility fracture in first degree relative.    Heart health: Get at least 150 minutes of aerobic exercise weekly Limit alcohol It is important to maintain a healthy blood pressure and healthy cholesterol numbers  Heart disease screening: Screening for heart disease includes screening for blood pressure, fasting lipids, glucose/diabetes screening, BMI height to weight ratio, reviewed of smoking status, physical activity, and diet.    Goals include blood pressure 120/80 or less, maintaining a healthy lipid/cholesterol profile, preventing diabetes or keeping diabetes numbers under good control, not smoking or using tobacco products, exercising most days per week or at least 150 minutes per week of exercise, and eating healthy variety of fruits and vegetables, healthy oils, and avoiding unhealthy food choices like fried food, fast food, high sugar and high cholesterol foods.    Other tests may possibly include EKG test, CT coronary calcium score, echocardiogram, exercise treadmill stress test.    Medical care options: I recommend you continue to seek care here first for routine care.  We try really hard to  have available appointments Monday through Friday daytime hours for sick visits, acute visits, and physicals.  Urgent care should be used for after hours and weekends for significant issues that cannot wait till the next day.  The emergency department should be used for significant potentially life-threatening emergencies.  The emergency department is expensive, can often have long wait times for less significant concerns, so try to utilize primary care, urgent care, or telemedicine when possible to avoid unnecessary trips to the emergency department.  Virtual visits and telemedicine have been introduced since the pandemic started in 2020, and can be convenient ways to receive medical care.  We offer virtual appointments as well to assist you in a variety of options to seek medical care.   Advanced Directives: I recommend you consider completing a Health Care Power of Attorney and Living Will.   These documents respect your wishes and help alleviate burdens on your loved ones if you were to become terminally ill or be in a position to need those documents enforced.    You can complete Advanced  Directives yourself, have them notarized, then have copies made for our office, for you and for anybody you feel should have them in safe keeping.  Or, you can have an attorney prepare these documents.   If you haven't updated your Last Will and Testament in a while, it may be worthwhile having an attorney prepare these documents together and save on some costs.       Separate significant issues discussed: Hyperlipidemia - labs today, continue current medication  Vit D deficiency - continue supplement, lab today  Trigger finger - referral to hand surgery  Chronic back pain, spondylosis, paresthesias - prior consults with neurology and podiatry.  Referral to [facility]     Chandlar was seen today for fasting cpe.  Diagnoses and all orders for this visit:  Encounter for health maintenance  examination in adult -     Comprehensive metabolic panel -     CBC with Differential/Platelet -     Lipid panel -     VITAMIN D 25 Hydroxy (Vit-D Deficiency, Fractures)  Needs flu shot -     Flu Vaccine QUAD 24mo+IM (Fluarix, Fluzone & Alfiuria Quad PF)  Chronic prostatitis  Vitamin D deficiency -     VITAMIN D 25 Hydroxy (Vit-D Deficiency, Fractures)  Vaccine counseling  Need for influenza vaccination  History of prostatitis  Lumbar spondylolysis -     Ambulatory referral to Spine Surgery  Gastroesophageal reflux disease, unspecified whether esophagitis present  Hyperlipidemia, unspecified hyperlipidemia type  Trigger ring finger of right hand -     Ambulatory referral to Hand Surgery  Leg paresthesia   Follow-up pending labs, yearly for physical

## 2021-06-10 ENCOUNTER — Other Ambulatory Visit: Payer: Self-pay | Admitting: Medical

## 2021-06-10 LAB — LIPID PANEL
Chol/HDL Ratio: 2.5 ratio (ref 0.0–5.0)
Cholesterol, Total: 182 mg/dL (ref 100–199)
HDL: 72 mg/dL (ref >39)
LDL Chol Calc (NIH): 95 mg/dL (ref 0–99)
Triglycerides: 82 mg/dL (ref 0–149)
VLDL Cholesterol Cal: 15 mg/dL (ref 5–40)

## 2021-06-10 LAB — COMPREHENSIVE METABOLIC PANEL
ALT: 33 IU/L (ref 0–44)
AST: 21 IU/L (ref 0–40)
Albumin/Globulin Ratio: 1.6 (ref 1.2–2.2)
Albumin: 3.9 g/dL (ref 3.8–4.8)
Alkaline Phosphatase: 91 IU/L (ref 44–121)
BUN/Creatinine Ratio: 22 (ref 10–24)
BUN: 21 mg/dL (ref 8–27)
Bilirubin Total: 2.1 mg/dL — ABNORMAL HIGH (ref 0.0–1.2)
CO2: 23 mmol/L (ref 20–29)
Calcium: 8.9 mg/dL (ref 8.6–10.2)
Chloride: 103 mmol/L (ref 96–106)
Creatinine, Ser: 0.94 mg/dL (ref 0.76–1.27)
Globulin, Total: 2.5 g/dL (ref 1.5–4.5)
Glucose: 81 mg/dL (ref 65–99)
Potassium: 3.9 mmol/L (ref 3.5–5.2)
Sodium: 138 mmol/L (ref 134–144)
Total Protein: 6.4 g/dL (ref 6.0–8.5)
eGFR: 92 mL/min/{1.73_m2} (ref >59)

## 2021-06-10 LAB — CBC WITH DIFFERENTIAL/PLATELET
Basophils Absolute: 0.1 10*3/uL (ref 0.0–0.2)
Basos: 1 %
EOS (ABSOLUTE): 0.1 10*3/uL (ref 0.0–0.4)
Eos: 1 %
Hematocrit: 46.6 % (ref 37.5–51.0)
Hemoglobin: 15.5 g/dL (ref 13.0–17.7)
Immature Grans (Abs): 0 10*3/uL (ref 0.0–0.1)
Immature Granulocytes: 0 %
Lymphocytes Absolute: 4.2 10*3/uL — ABNORMAL HIGH (ref 0.7–3.1)
Lymphs: 58 %
MCH: 29.5 pg (ref 26.6–33.0)
MCHC: 33.3 g/dL (ref 31.5–35.7)
MCV: 89 fL (ref 79–97)
Monocytes Absolute: 0.6 10*3/uL (ref 0.1–0.9)
Monocytes: 9 %
Neutrophils Absolute: 2.2 10*3/uL (ref 1.4–7.0)
Neutrophils: 31 %
Platelets: 150 10*3/uL (ref 150–450)
RBC: 5.25 x10E6/uL (ref 4.14–5.80)
RDW: 13.2 % (ref 11.6–15.4)
WBC: 7.1 10*3/uL (ref 3.4–10.8)

## 2021-06-10 LAB — VITAMIN D 25 HYDROXY (VIT D DEFICIENCY, FRACTURES): Vit D, 25-Hydroxy: 28.5 ng/mL — ABNORMAL LOW (ref 30.0–100.0)

## 2021-06-10 MED ORDER — VITAMIN D 50 MCG (2000 UT) PO CAPS: 1.0000 | ORAL_CAPSULE | Freq: Every day | ORAL | 3 refills | Status: DC

## 2021-06-10 MED ORDER — PRAVASTATIN SODIUM 20 MG PO TABS
20.0000 mg | ORAL_TABLET | Freq: Every evening | ORAL | 3 refills | Status: DC
Start: 2021-06-10 — End: 2022-06-11

## 2021-06-25 ENCOUNTER — Telehealth: Payer: Self-pay | Admitting: Medical

## 2021-06-25 NOTE — Telephone Encounter (Signed)
Received requested records from [facility].  

## 2021-08-19 ENCOUNTER — Other Ambulatory Visit: Payer: Self-pay

## 2021-08-19 ENCOUNTER — Ambulatory Visit (INDEPENDENT_AMBULATORY_CARE_PROVIDER_SITE_OTHER): Payer: BC Managed Care – PPO | Admitting: Medical

## 2021-08-19 VITALS — BP 130/80 | HR 76 | Wt 208.0 lb

## 2021-08-19 DIAGNOSIS — K921 Melena: Secondary | ICD-10-CM | POA: Diagnosis not present

## 2021-08-19 DIAGNOSIS — K6289 Other specified diseases of anus and rectum: Secondary | ICD-10-CM | POA: Diagnosis not present

## 2021-08-19 NOTE — Progress Notes (Addendum)
Subjective:  Joshua Torres is a 60 y.o. male who presents for Chief Complaint  Patient presents with   Blood In Stools    Blood in stools since Sunday. Stools are semi-hard. Went to the bathroom this morning and didn't see blood.      Here for concerns for blood in stool.  Saw blood in one bowel movement 2 days ago. Was bright red. Was in bowl and on toilet paper.  Seemed to be mixed in the stool possibly as well.  No pain with bowel movement.  Stool was somewhat formed.   No recent constipation.  In general BMs have been good.   No recent hemorrhoids obvious, no straining.   Thinks he had some chicken wings with teriyaki the meal before, but no other reasons to suspect blood.  No other blood in stool.   Has had some discomfort sitting, possible discomfort at anus.  BM today was semi hard.  Typically has 1 or 2 formed stools daily.  No recent NSAIDs.   Has spinal steroid injection 08/05/21.  No recent alcohol.   Has had some spicy foods on Saturday night the day before this episode.  No other aggravating or relieving factors.    No other c/o.  Past Medical History:  Diagnosis Date   GERD (gastroesophageal reflux disease)    Osteoarthritis    left middle finger   Prostatitis    Wears glasses    Current Outpatient Medications on File Prior to Visit  Medication Sig Dispense Refill   Cholecalciferol (VITAMIN D) 50 MCG (2000 UT) CAPS Take 1 capsule (2,000 Units total) by mouth daily. 90 capsule 3   Cyanocobalamin (VITAMIN B12 PO) Take by mouth.     pravastatin (PRAVACHOL) 20 MG tablet Take 1 tablet (20 mg total) by mouth every evening. 90 tablet 3   No current facility-administered medications on file prior to visit.   Past Surgical History:  Procedure Laterality Date   COLONOSCOPY  01/2017   Dr. Loreta Ave, prior 2011   COLONOSCOPY  2018   polyps; Dr. Loreta Ave, repeat 5 years     The following portions of the patient's history were reviewed and updated as appropriate: allergies, current  medications, past family history, past medical history, past social history, past surgical history and problem list.  ROS Otherwise as in subjective above    Objective: BP 130/80   Pulse 76   Wt 208 lb (94.3 kg)   BMI 27.44 kg/m   General appearance: alert, no distress, well developed, well nourished Abdomen: +bs, soft, non tender, non distended, no masses, no hepatomegaly, no splenomegaly Rectal: external no lesions, anoscopy reveals some mildly irritated mucosa, but no obvious bleeding hemorrhoid  I reviewed his colonoscopy report from 2018 with Dr. Loreta Ave.  He is due back next year 2023 for routine screening    Assessment: Encounter Diagnoses  Name Primary?   Blood in stool Yes   Rectal discomfort      Plan: We discussed symptoms and concerns and findings.  Advised hot soapy water bath soaks for the next few nights for rectal discomfort, can use over-the-counter Preparation H for a few days.  Advised of 1 or 2 episodes of blood short-term the stool then no worries.  However he continues to have bleeding over the next few weeks will refer back to gastroenterology early.  He is supposed to have repeat colonoscopy next year 2023 anyhow but if he continues to have blood we will go ahead and expedite evaluation  I  suspect he had some GI irritation from spicy foods recently and possibly some less than formed stool irritating the colon   Joshua Torres was seen today for blood in stools.  Diagnoses and all orders for this visit:  Blood in stool -     PR DIAGNOSTIC ANOSCOPY  Rectal discomfort   Follow up: prn

## 2021-09-25 ENCOUNTER — Other Ambulatory Visit: Payer: Self-pay

## 2021-09-25 ENCOUNTER — Ambulatory Visit: Payer: BC Managed Care – PPO | Admitting: Medical

## 2021-09-25 VITALS — BP 120/80 | HR 67 | Wt 202.8 lb

## 2021-09-25 DIAGNOSIS — M7711 Lateral epicondylitis, right elbow: Secondary | ICD-10-CM

## 2021-09-25 DIAGNOSIS — K921 Melena: Secondary | ICD-10-CM

## 2021-09-25 DIAGNOSIS — R634 Abnormal weight loss: Secondary | ICD-10-CM | POA: Diagnosis not present

## 2021-09-25 DIAGNOSIS — M79672 Pain in left foot: Secondary | ICD-10-CM

## 2021-09-25 LAB — URINALYSIS
Bilirubin, UA: NEGATIVE
Glucose, UA: NEGATIVE
Ketones, UA: NEGATIVE
Leukocytes,UA: NEGATIVE
Nitrite, UA: NEGATIVE
Protein,UA: NEGATIVE
RBC, UA: NEGATIVE
Specific Gravity, UA: 1.008 (ref 1.005–1.030)
Urobilinogen, Ur: 0.2 mg/dL (ref 0.2–1.0)
pH, UA: 7 (ref 5.0–7.5)

## 2021-09-25 LAB — CBC WITH DIFFERENTIAL/PLATELET
Basophils Absolute: 0.1 10*3/uL (ref 0.0–0.2)
Basos: 1 %
EOS (ABSOLUTE): 0.1 10*3/uL (ref 0.0–0.4)
Eos: 1 %
Hematocrit: 47.1 % (ref 37.5–51.0)
Hemoglobin: 15.9 g/dL (ref 13.0–17.7)
Immature Grans (Abs): 0 10*3/uL (ref 0.0–0.1)
Immature Granulocytes: 1 %
Lymphocytes Absolute: 2.1 10*3/uL (ref 0.7–3.1)
Lymphs: 48 %
MCH: 29.4 pg (ref 26.6–33.0)
MCHC: 33.8 g/dL (ref 31.5–35.7)
MCV: 87 fL (ref 79–97)
Monocytes Absolute: 0.5 10*3/uL (ref 0.1–0.9)
Monocytes: 12 %
Neutrophils Absolute: 1.6 10*3/uL (ref 1.4–7.0)
Neutrophils: 37 %
Platelets: 184 10*3/uL (ref 150–450)
RBC: 5.4 x10E6/uL (ref 4.14–5.80)
RDW: 12.8 % (ref 11.6–15.4)
WBC: 4.3 10*3/uL (ref 3.4–10.8)

## 2021-09-25 LAB — COMPREHENSIVE METABOLIC PANEL
ALT: 24 IU/L (ref 0–44)
AST: 22 IU/L (ref 0–40)
Albumin/Globulin Ratio: 2.4 — ABNORMAL HIGH (ref 1.2–2.2)
Albumin: 4.5 g/dL (ref 3.8–4.8)
Alkaline Phosphatase: 107 IU/L (ref 44–121)
BUN/Creatinine Ratio: 14 (ref 10–24)
BUN: 13 mg/dL (ref 8–27)
Bilirubin Total: 1.6 mg/dL — ABNORMAL HIGH (ref 0.0–1.2)
CO2: 23 mmol/L (ref 20–29)
Calcium: 9.2 mg/dL (ref 8.6–10.2)
Chloride: 103 mmol/L (ref 96–106)
Creatinine, Ser: 0.92 mg/dL (ref 0.76–1.27)
Globulin, Total: 1.9 g/dL (ref 1.5–4.5)
Glucose: 85 mg/dL (ref 70–99)
Potassium: 4.4 mmol/L (ref 3.5–5.2)
Sodium: 139 mmol/L (ref 134–144)
Total Protein: 6.4 g/dL (ref 6.0–8.5)
eGFR: 95 mL/min/{1.73_m2} (ref >59)

## 2021-09-25 LAB — TSH: TSH: 1.22 u[IU]/mL (ref 0.450–4.500)

## 2021-09-25 NOTE — Progress Notes (Signed)
Subjective:  Joshua Torres is a 61 y.o. male who presents for Chief Complaint  Patient presents with   weight loss    Weight loss, feels like his clothes are falling off of him x about a month     Here for concerns of unintentional weight loss.   He notes about a month has lost some weight, clothes fitting looser.   Sister noticed as well.   Doing same amount of exercise, no change in diet, no loss of appetite.   No fever, no night sweats.  No abdominal pain, no dyspnea.    Had some recent muscle pain in right forearm, and some left foot pain, but no other pain. X 3 weeks.  Had 1 episode of blood in stool a few months ago.   No additional blood in stool.  Bowels have been more formed of late compared to loose stools weeks ago.  No other aggravating or relieving factors.    No other c/o.  Past Medical History:  Diagnosis Date   GERD (gastroesophageal reflux disease)    Osteoarthritis    left middle finger   Prostatitis    Wears glasses    Current Outpatient Medications on File Prior to Visit  Medication Sig Dispense Refill   Cholecalciferol (VITAMIN D) 50 MCG (2000 UT) CAPS Take 1 capsule (2,000 Units total) by mouth daily. 90 capsule 3   Cyanocobalamin (VITAMIN B12 PO) Take by mouth.     pravastatin (PRAVACHOL) 20 MG tablet Take 1 tablet (20 mg total) by mouth every evening. 90 tablet 3   No current facility-administered medications on file prior to visit.   Past Surgical History:  Procedure Laterality Date   COLONOSCOPY  01/2017   Dr. Loreta Ave, prior 2011   COLONOSCOPY  2018   polyps; Dr. Loreta Ave, repeat 5 years     The following portions of the patient's history were reviewed and updated as appropriate: allergies, current medications, past family history, past medical history, past social history, past surgical history and problem list.  ROS Otherwise as in subjective above    Objective: BP 120/80    Pulse 67    Wt 202 lb 12.8 oz (92 kg)    BMI 26.76 kg/m   Wt  Readings from Last 3 Encounters:  09/25/21 202 lb 12.8 oz (92 kg)  08/19/21 208 lb (94.3 kg)  06/09/21 204 lb 3.2 oz (92.6 kg)    General appearance: alert, no distress, well developed, well nourished HEENT: normocephalic, sclerae anicteric, conjunctiva pink and moist, TMs pearly, nares patent, no discharge or erythema, pharynx normal Oral cavity: MMM, no lesions Neck: supple, no lymphadenopathy, no thyromegaly, no masses Heart: RRR, normal S1, S2, no murmurs Lungs: CTA bilaterally, no wheezes, rhonchi, or rales Abdomen: +bs, soft, non tender, non distended, no masses, no hepatomegaly, no splenomegaly Msk: tender right lateral epicondyle, pain with resisted wrist supination, tender left volar foot metatarsal of 2nd and 3rd toes, otherwise UE and LE unremarkable Pulses: 2+ radial pulses, 2+ pedal pulses, normal cap refill Ext: no edema   Assessment: Encounter Diagnoses  Name Primary?   Unintentional weight loss Yes   Foot pain, left    Right tennis elbow    Blood in stool      Plan: Unintentional weight loss - discussed concerns.  He was 28 BMI in 2021.   He has been running about 204lb September.  Gained some weight in November but down to 202lb.   We discussed whether this represents his new normal  vs worrisome causes.   At this point labs today, referral back to GI since he is due this year for colonoscopy and had recent episode of brief blood in stool.  Consider imaging particularly if he continues to lose weight.    Right tennis elbow - he just had spinal steroid shot so use Tylenol x 5 days, then can use Aleve once or twice daily x 1-2 weeks for pain and inflammation.   Consider tennis elbow strap.  Bag of frozen peas or cool therapy BID the next week.   If not improving, return for other treatment options.  Foot pain - advised good supportive shoes, not the loafers he has been using.   Ice  water bath as needed, tylenol for the next 5 days, then can use OTC aleve for a week.    Consider f/u with podiatry  Joshua Torres was seen today for weight loss.  Diagnoses and all orders for this visit:  Unintentional weight loss -     TSH -     CBC with Differential/Platelet -     Comprehensive metabolic panel -     Urinalysis -     Ambulatory referral to Gastroenterology  Foot pain, left  Right tennis elbow  Blood in stool -     Ambulatory referral to Gastroenterology   Follow up: pending labs

## 2021-10-22 HISTORY — PX: COLONOSCOPY: SHX5424

## 2021-11-12 ENCOUNTER — Encounter: Payer: Self-pay | Admitting: Internal Medicine

## 2021-11-12 LAB — HM COLONOSCOPY

## 2021-11-27 ENCOUNTER — Encounter: Payer: Self-pay | Admitting: Medical

## 2021-12-30 ENCOUNTER — Ambulatory Visit (INDEPENDENT_AMBULATORY_CARE_PROVIDER_SITE_OTHER): Payer: BC Managed Care – PPO | Admitting: Physician Assistant

## 2021-12-30 ENCOUNTER — Encounter: Payer: Self-pay | Admitting: Physician Assistant

## 2021-12-30 VITALS — BP 130/90 | HR 70 | Ht 73.0 in | Wt 215.8 lb

## 2021-12-30 DIAGNOSIS — K649 Unspecified hemorrhoids: Secondary | ICD-10-CM | POA: Diagnosis not present

## 2021-12-30 MED ORDER — HYDROCORTISONE ACETATE 25 MG RE SUPP: 25.0000 mg | Freq: Two times a day (BID) | RECTAL | 0 refills | Status: DC

## 2021-12-30 NOTE — Patient Instructions (Signed)
You will get a call to schedule an appointment with General Surgery ? ?

## 2021-12-30 NOTE — Progress Notes (Signed)
? ?Acute Office Visit ? ?Subjective:  ? ? Patient ID: Joshua Torres, male    DOB: [birth year], 61 y.o.   MRN: [id number] ? ?Chief Complaint  ?Patient presents with  ? Acute Visit  ?  Hemorrhoids. Pt took over the counter meds for about 7 days and is still having some pain. He has not seen in blood  ? ? ?HPI ?Patient is in today for a painful hemorrhoid; reports that he had a colonoscopy in 10/2021 and he was told that he had an internal inflamed hemorrhoid; states he has been using OTC hemorrhoid cream that has calmed down the inflammation, but has rectal pain and burning; denies blood in stool or constipation. ? ? ?Outpatient Medications Prior to Visit  ?Medication Sig Dispense Refill  ? Cholecalciferol (VITAMIN D) 50 MCG (2000 UT) CAPS Take 1 capsule (2,000 Units total) by mouth daily. 90 capsule 3  ? Cyanocobalamin (VITAMIN B12 PO) Take by mouth.    ? pravastatin (PRAVACHOL) 20 MG tablet Take 1 tablet (20 mg total) by mouth every evening. 90 tablet 3  ? ?No facility-administered medications prior to visit.  ? ? ?Allergies  ?Allergen Reactions  ? Ciprofloxacin   ?  Achilles tendon ache, finger aches  ? ? ?Review of Systems  ?Constitutional:  Negative for activity change, chills, fatigue and fever.  ?HENT:  Negative for congestion, ear pain, hearing loss and voice change.   ?Eyes:  Negative for pain and redness.  ?Respiratory:  Negative for cough and shortness of breath.   ?Cardiovascular:  Negative for leg swelling.  ?Gastrointestinal:  Negative for constipation, diarrhea, nausea and vomiting.  ?Endocrine: Negative for polyuria.  ?Genitourinary:  Negative for difficulty urinating, dysuria, flank pain and frequency.  ?Musculoskeletal:  Negative for joint swelling and neck pain.  ?Skin:  Negative for rash.  ?Neurological:  Negative for dizziness.  ?Hematological:  Does not bruise/bleed easily.  ?Psychiatric/Behavioral:  Negative for agitation and behavioral problems.   ? ?   ?Objective:  ?  ?Physical Exam ?Vitals and  nursing note reviewed.  ?Constitutional:   ?   General: He is not in acute distress. ?   Appearance: Normal appearance.  ?HENT:  ?   Head: Normocephalic and atraumatic.  ?   Right Ear: External ear normal.  ?   Left Ear: External ear normal.  ?   Nose: No congestion.  ?Eyes:  ?   Extraocular Movements: Extraocular movements intact.  ?   Conjunctiva/sclera: Conjunctivae normal.  ?   Pupils: Pupils are equal, round, and reactive to light.  ?Pulmonary:  ?   Breath sounds: No wheezing.  ?Genitourinary: ?   Rectum: Internal hemorrhoid present.  ?Musculoskeletal:     ?   General: Normal range of motion.  ?   Cervical back: Normal range of motion and neck supple.  ?   Right lower leg: No edema.  ?   Left lower leg: No edema.  ?Skin: ?   General: Skin is warm and dry.  ?   Findings: No rash.  ?Neurological:  ?   Mental Status: He is alert and oriented to person, place, and time.  ?   Gait: Gait normal.  ?Psychiatric:     ?   Mood and Affect: Mood normal.     ?   Behavior: Behavior normal.  ? ? ?BP 130/90   Pulse 70   Wt 215 lb 12.8 oz (97.9 kg)   SpO2 99%   BMI 28.47 kg/m?  ? ?Wt Readings from  Last 3 Encounters:  ?12/30/21 215 lb 12.8 oz (97.9 kg)  ?09/25/21 202 lb 12.8 oz (92 kg)  ?08/19/21 208 lb (94.3 kg)  ? ? ?Results for orders placed or performed in visit on 11/12/21  ?[facility] COLONOSCOPY  ?Result Value Ref Range  ? [facility] Colonoscopy See Report (in chart) See Report (in chart), Patient Reported  ? ? ?   ?Assessment & Plan:  ?1. Hemorrhoids, unspecified hemorrhoid type ?- Ambulatory referral to General Surgery ?Anusol cream, referral to General Surgery for further evaluation of internal hemorrhoid  ? ? ?Meds ordered this encounter  ?Medications  ? hydrocortisone (ANUSOL-HC) 25 MG suppository  ?  Sig: Place 1 suppository (25 mg total) rectally 2 (two) times daily.  ?  Dispense:  12 suppository  ?  Refill:  0  ?  Order Specific Question:   Supervising Provider  ?  Answer:   Ronnald Nian [[id number]]  ? ? ?Return for Return as  Already Scheduled. ? ?Jake Shark, PA-C ?

## 2022-05-27 ENCOUNTER — Encounter: Payer: Self-pay | Admitting: Internal Medicine

## 2022-06-09 ENCOUNTER — Telehealth: Payer: Self-pay | Admitting: Medical

## 2022-06-10 ENCOUNTER — Ambulatory Visit: Payer: BC Managed Care – PPO | Admitting: Medical

## 2022-06-10 VITALS — BP 120/82 | HR 70 | Ht 73.0 in | Wt 207.6 lb

## 2022-06-10 DIAGNOSIS — K219 Gastro-esophageal reflux disease without esophagitis: Secondary | ICD-10-CM

## 2022-06-10 DIAGNOSIS — E785 Hyperlipidemia, unspecified: Secondary | ICD-10-CM | POA: Diagnosis not present

## 2022-06-10 DIAGNOSIS — E559 Vitamin D deficiency, unspecified: Secondary | ICD-10-CM

## 2022-06-10 DIAGNOSIS — Z125 Encounter for screening for malignant neoplasm of prostate: Secondary | ICD-10-CM

## 2022-06-10 DIAGNOSIS — N411 Chronic prostatitis: Secondary | ICD-10-CM

## 2022-06-10 DIAGNOSIS — R053 Chronic cough: Secondary | ICD-10-CM

## 2022-06-10 DIAGNOSIS — Z87438 Personal history of other diseases of male genital organs: Secondary | ICD-10-CM

## 2022-06-10 DIAGNOSIS — Z0000 Encounter for general adult medical examination without abnormal findings: Secondary | ICD-10-CM | POA: Diagnosis not present

## 2022-06-10 DIAGNOSIS — Z136 Encounter for screening for cardiovascular disorders: Secondary | ICD-10-CM | POA: Insufficient documentation

## 2022-06-10 DIAGNOSIS — J329 Chronic sinusitis, unspecified: Secondary | ICD-10-CM

## 2022-06-10 DIAGNOSIS — Z7185 Encounter for immunization safety counseling: Secondary | ICD-10-CM

## 2022-06-10 LAB — POCT URINALYSIS DIP (PROADVANTAGE DEVICE)
Bilirubin, UA: NEGATIVE
Blood, UA: NEGATIVE
Glucose, UA: NEGATIVE mg/dL
Ketones, POC UA: NEGATIVE mg/dL
Leukocytes, UA: NEGATIVE
Nitrite, UA: NEGATIVE
Protein Ur, POC: NEGATIVE mg/dL
Specific Gravity, Urine: 1.015
Urobilinogen, Ur: NEGATIVE
pH, UA: 6 (ref 5.0–8.0)

## 2022-06-10 MED ORDER — AMOXICILLIN-POT CLAVULANATE 875-125 MG PO TABS: 1.0000 | ORAL_TABLET | Freq: Two times a day (BID) | ORAL | 0 refills | Status: DC

## 2022-06-10 MED ORDER — MUPIROCIN 2 % EX OINT: 1.0000 | TOPICAL_OINTMENT | Freq: Two times a day (BID) | CUTANEOUS | 0 refills | Status: DC

## 2022-06-10 MED ORDER — GUAIFENESIN-CODEINE 100-10 MG/5ML PO SOLN: 5.0000 mL | Freq: Four times a day (QID) | ORAL | 0 refills | Status: DC | PRN

## 2022-06-10 MED ORDER — DOXYCYCLINE HYCLATE 100 MG PO TABS: 100.0000 mg | ORAL_TABLET | Freq: Two times a day (BID) | ORAL | 0 refills | Status: DC

## 2022-06-10 NOTE — Patient Instructions (Signed)
This visit was a preventative care visit, also known as wellness visit or routine physical.   Topics typically include healthy lifestyle, diet, exercise, preventative care, vaccinations, sick and well care, proper use of emergency dept and after hours care, as well as other concerns.     Recommendations: Continue to return yearly for your annual wellness and preventative care visits.  This gives [name] a chance to discuss healthy lifestyle, exercise, vaccinations, review your chart record, and perform screenings where appropriate.  I recommend you see your eye doctor yearly for routine vision care.  I recommend you see your dentist yearly for routine dental care including hygiene visits twice yearly.   Vaccination recommendations were reviewed Immunization History  Administered Date(s) Administered   Influenza,inj,Quad PF,6+ Mos 05/16/2019, 05/16/2020, 06/09/2021   Moderna Sars-Covid-2 Vaccination 01/26/2020, 02/23/2020   Td 04/29/2015   Zoster Recombinat (Shingrix) 06/10/2019, 08/08/2019   Return soon at your convenience for hepatitis A vaccine #1, flu shot and COVID-vaccine.   Screening for cancer: Colon cancer screening: I reviewed your colonoscopy on file that is up to date from 10/2021 showing internal hemorrhoids, otherwise normal.  We discussed PSA, prostate exam, and prostate cancer screening risks/benefits.     Skin cancer screening: Check your skin regularly for new changes, growing lesions, or other lesions of concern Come in for evaluation if you have skin lesions of concern.  Lung cancer screening: If you have a greater than 20 pack year history of tobacco use, then you may qualify for lung cancer screening with a chest CT scan.   Please call your insurance company to inquire about coverage for this test.  We currently don't have screenings for other cancers besides breast, cervical, colon, and lung cancers.  If you have a strong family history of cancer or have other  cancer screening concerns, please let me know.    Bone health: Get at least 150 minutes of aerobic exercise weekly Get weight bearing exercise at least once weekly Bone density test:  A bone density test is an imaging test that uses a type of X-ray to measure the amount of calcium and other minerals in your bones. The test may be used to diagnose or screen you for a condition that causes weak or thin bones (osteoporosis), predict your risk for a broken bone (fracture), or determine how well your osteoporosis treatment is working. The bone density test is recommended for females 51 and older, or females or males <16 if certain risk factors such as thyroid disease, long term use of steroids such as for asthma or rheumatological issues, vitamin D deficiency, estrogen deficiency, family history of osteoporosis, self or family history of fragility fracture in first degree relative.    Heart health: Get at least 150 minutes of aerobic exercise weekly Limit alcohol It is important to maintain a healthy blood pressure and healthy cholesterol numbers  Heart disease screening: Screening for heart disease includes screening for blood pressure, fasting lipids, glucose/diabetes screening, BMI height to weight ratio, reviewed of smoking status, physical activity, and diet.    Goals include blood pressure 120/80 or less, maintaining a healthy lipid/cholesterol profile, preventing diabetes or keeping diabetes numbers under good control, not smoking or using tobacco products, exercising most days per week or at least 150 minutes per week of exercise, and eating healthy variety of fruits and vegetables, healthy oils, and avoiding unhealthy food choices like fried food, fast food, high sugar and high cholesterol foods.    Other tests may possibly include EKG  test, CT coronary calcium score, echocardiogram, exercise treadmill stress test.   Referral today for CT coronary calcium test  Normal ABI  09/2020   Medical care options: I recommend you continue to seek care here first for routine care.  We try really hard to have available appointments Monday through Friday daytime hours for sick visits, acute visits, and physicals.  Urgent care should be used for after hours and weekends for significant issues that cannot wait till the next day.  The emergency department should be used for significant potentially life-threatening emergencies.  The emergency department is expensive, can often have long wait times for less significant concerns, so try to utilize primary care, urgent care, or telemedicine when possible to avoid unnecessary trips to the emergency department.  Virtual visits and telemedicine have been introduced since the pandemic started in 2020, and can be convenient ways to receive medical care.  We offer virtual appointments as well to assist you in a variety of options to seek medical care.   Advanced Directives: I recommend you consider completing a [place] and Living Will.   These documents respect your wishes and help alleviate burdens on your loved ones if you were to become terminally ill or be in a position to need those documents enforced.    You can complete Advanced Directives yourself, have them notarized, then have copies made for our office, for you and for anybody you feel should have them in safe keeping.  Or, you can have an attorney prepare these documents.   If you haven't updated your Last Will and Testament in a while, it may be worthwhile having an attorney prepare these documents together and save on some costs.       Separate significant issues discussed: Cough, chronic sinusitis-begin medications today Augmentin and cough medicine as below, good hydration daily.  If not completely resolved in 2 weeks then consider baseline chest x-ray  Travel to [place] Reviewed CDC travel recommendations for [place] Based on our discussion  today you do not need typhoid or malaria prophylaxis You may want to consider getting cholera vaccine from the health department Avoid drinking local water, drink only bottled water or treated water Avoid local drinks May with local juices as that could also be a source of contamination Return at your convenience for vaccines including hepatitis A vaccine, flu shot and COVID-vaccine I recommend taking an antibiotic called doxycycline with you on your trip to [place].  This vaccine can be used for skin infections, tick borne illness, and general respiratory tract infection I also prescribed mupirocin antibiotic ointment to take with you as a good topical antibiotic for skin infection

## 2022-06-10 NOTE — Progress Notes (Signed)
Subjective: Chief Complaint  Patient presents with   fasting cpe    Fasting cpe, declines flu shot today.     Medical Team: Dr. Nita Sickleonika Patel, neurology Dr. Darnell Levelodd Gerkin, general surgery Dr. Betha LoaKevin Kuzma, hand surgery Dr. Sharl MaAdam McDonald, podiatry Dentist Eye doctor Elijah Phommachanh, Kermit Baloavid S, PA-C  Concerns: On and off for the last few months since July he has had sinsues draining, coughing, some sore throat, cough, intermittent.  Non-smoker.  Has used some Afrin.  Not currently using anything for symptoms.  Otherwise normal state of health   He is having a steroid injection per orthopedics next month.  He cannot have any vaccines today until after his steroid shot   [place] is [place] to [place] at the end of the year.  He has questions about vaccines and other recommendations  Reviewed their medical, surgical, family, social, medication, and allergy history and updated chart as appropriate.  Past Medical History:  Diagnosis Date   GERD (gastroesophageal reflux disease)    Osteoarthritis    left middle finger   Prostatitis    Wears glasses     Past Surgical History:  Procedure Laterality Date   COLONOSCOPY  01/2017   Dr. Loreta AveMann, prior 2011   COLONOSCOPY  2018   polyps; Dr. Loreta AveMann, repeat 5 years    Family History  Problem Relation Age of Onset   Cancer Father        brain   Stroke Father    Diabetes Sister    Diabetes Mother    Other Brother        death related to a fall, head contusion   Asthma Sister    Heart disease Neg Hx      Current Outpatient Medications:    amoxicillin-clavulanate (AUGMENTIN) 875-125 MG tablet, Take 1 tablet by mouth 2 (two) times daily., Disp: 20 tablet, Rfl: 0   Cholecalciferol (VITAMIN D) 50 MCG (2000 UT) CAPS, Take 1 capsule (2,000 Units total) by mouth daily., Disp: 90 capsule, Rfl: 3   Cyanocobalamin (VITAMIN B12 PO), Take by mouth., Disp: , Rfl:    doxycycline (VIBRA-TABS) 100 MG tablet, Take 1 tablet (100 mg total) by mouth 2 (two)  times daily., Disp: 14 tablet, Rfl: 0   guaiFENesin-codeine 100-10 MG/5ML syrup, Take 5 mLs by mouth every 6 (six) hours as needed for cough., Disp: 120 mL, Rfl: 0   mupirocin ointment (BACTROBAN) 2 %, Apply 1 Application topically 2 (two) times daily., Disp: 22 g, Rfl: 0   pravastatin (PRAVACHOL) 20 MG tablet, Take 1 tablet (20 mg total) by mouth every evening., Disp: 90 tablet, Rfl: 3  Allergies  Allergen Reactions   Ciprofloxacin     Achilles tendon ache, finger aches     Review of Systems  Constitutional:  Negative for chills, fever, malaise/fatigue and weight loss.  HENT:  Positive for congestion, sinus pain and sore throat. Negative for ear pain, hearing loss and tinnitus.   Eyes:  Negative for blurred vision, pain and redness.  Respiratory:  Positive for cough and sputum production. Negative for hemoptysis and shortness of breath.   Cardiovascular:  Negative for chest pain, palpitations, orthopnea, claudication and leg swelling.  Gastrointestinal:  Negative for abdominal pain, blood in stool, constipation, diarrhea, nausea and vomiting.  Genitourinary:  Negative for dysuria, flank pain, frequency, hematuria and urgency.  Musculoskeletal:  Negative for falls, joint pain and myalgias.  Skin:  Negative for itching and rash.  Neurological:  Negative for dizziness, tingling, speech change, weakness and headaches.  Endo/Heme/Allergies:  Negative for polydipsia. Does not bruise/bleed easily.  Psychiatric/Behavioral:  Negative for depression and memory loss. The patient is not nervous/anxious and does not have insomnia.         06/10/2022    8:39 AM 09/25/2021    9:33 AM 08/19/2021    8:21 AM 06/09/2021    8:33 AM 05/16/2020    8:37 AM  Depression screen PHQ 2/9  Decreased Interest 0 0 0 0 0  Down, Depressed, Hopeless 0 0 0 0 0  PHQ - 2 Score 0 0 0 0 0        Objective:  BP 120/82   Pulse 70   Ht 6\' 1"  (1.854 m)   Wt 207 lb 9.6 oz (94.2 kg)   BMI 27.39 kg/m   General  appearance: alert, no distress, WD/WN, African American male Skin: unremarkable HEENT: normocephalic, conjunctiva/corneas normal, sclerae anicteric, PERRLA, EOMi, nares with turbinated edema, no discharge or erythema, pharynx normal Oral cavity: MMM, tongue normal, teeth normal Neck: supple, no lymphadenopathy, no thyromegaly, no masses, normal ROM, no bruits Chest: non tender, normal shape and expansion Heart: RRR, normal S1, S2, no murmurs Lungs: somewhat decrees breath sounds, no wheezes, rhonchi, or rales Abdomen: +bs, soft, non tender, non distended, no masses, no hepatomegaly, no splenomegaly, no bruits Back: non tender, normal ROM, no scoliosis Musculoskeletal: upper extremities non tender, no obvious deformity, normal ROM throughout, lower extremities non tender, no obvious deformity, normal ROM throughout Extremities: no edema, no cyanosis, no clubbing Pulses: 2+ symmetric, upper and lower extremities, normal cap refill Neurological: alert, oriented x 3, CN2-12 intact, strength normal upper extremities and lower extremities, sensation normal throughout, DTRs 2+ throughout, no cerebellar signs, gait normal Psychiatric: normal affect, behavior normal, pleasant  GU: normal male external genitalia,circumcised, nontender, no masses, no hernia, no lymphadenopathy Rectal: deferred , declined   Assessment and Plan :   Encounter Diagnoses  Name Primary?   Encounter for health maintenance examination in adult Yes   Gastroesophageal reflux disease, unspecified whether esophagitis present    Hyperlipidemia, unspecified hyperlipidemia type    History of prostatitis    Vitamin D deficiency    Vaccine counseling    Chronic prostatitis    Screening for heart disease    Chronic cough    Chronic sinusitis, unspecified location    Screening for prostate cancer     This visit was a preventative care visit, also known as wellness visit or routine physical.   Topics typically include healthy  lifestyle, diet, exercise, preventative care, vaccinations, sick and well care, proper use of emergency dept and after hours care, as well as other concerns.     Recommendations: Continue to return yearly for your annual wellness and preventative care visits.  This gives a chance to discuss healthy lifestyle, exercise, vaccinations, review your chart record, and perform screenings where appropriate.  I recommend you see your eye doctor yearly for routine vision care.  I recommend you see your dentist yearly for routine dental care including hygiene visits twice yearly.   Vaccination recommendations were reviewed Immunization History  Administered Date(s) Administered   Influenza,inj,Quad PF,6+ Mos 05/16/2019, 05/16/2020, 06/09/2021   Moderna Sars-Covid-2 Vaccination 01/26/2020, 02/23/2020   Td 04/29/2015   Zoster Recombinat (Shingrix) 06/10/2019, 08/08/2019   Return soon at your convenience for hepatitis A vaccine #1, flu shot and COVID-vaccine.   Screening for cancer: Colon cancer screening: I reviewed your colonoscopy on file that is up to date from 10/2021 showing internal hemorrhoids,  otherwise normal.  We discussed PSA, prostate exam, and prostate cancer screening risks/benefits.     Skin cancer screening: Check your skin regularly for new changes, growing lesions, or other lesions of concern Come in for evaluation if you have skin lesions of concern.  Lung cancer screening: If you have a greater than 20 pack year history of tobacco use, then you may qualify for lung cancer screening with a chest CT scan.   Please call your insurance company to inquire about coverage for this test.  We currently don't have screenings for other cancers besides breast, cervical, colon, and lung cancers.  If you have a strong family history of cancer or have other cancer screening concerns, please let me know.    Bone health: Get at least 150 minutes of aerobic exercise weekly Get weight  bearing exercise at least once weekly Bone density test:  A bone density test is an imaging test that uses a type of X-ray to measure the amount of calcium and other minerals in your bones. The test may be used to diagnose or screen you for a condition that causes weak or thin bones (osteoporosis), predict your risk for a broken bone (fracture), or determine how well your osteoporosis treatment is working. The bone density test is recommended for females 65 and older, or females or males <65 if certain risk factors such as thyroid disease, long term use of steroids such as for asthma or rheumatological issues, vitamin D deficiency, estrogen deficiency, family history of osteoporosis, self or family history of fragility fracture in first degree relative.    Heart health: Get at least 150 minutes of aerobic exercise weekly Limit alcohol It is important to maintain a healthy blood pressure and healthy cholesterol numbers  Heart disease screening: Screening for heart disease includes screening for blood pressure, fasting lipids, glucose/diabetes screening, BMI height to weight ratio, reviewed of smoking status, physical activity, and diet.    Goals include blood pressure 120/80 or less, maintaining a healthy lipid/cholesterol profile, preventing diabetes or keeping diabetes numbers under good control, not smoking or using tobacco products, exercising most days per week or at least 150 minutes per week of exercise, and eating healthy variety of fruits and vegetables, healthy oils, and avoiding unhealthy food choices like fried food, fast food, high sugar and high cholesterol foods.    Other tests may possibly include EKG test, CT coronary calcium score, echocardiogram, exercise treadmill stress test.   Referral today for CT coronary calcium test  Normal ABI 09/2020   Medical care options: I recommend you continue to seek care here first for routine care.  We try really hard to have available  appointments Monday through Friday daytime hours for sick visits, acute visits, and physicals.  Urgent care should be used for after hours and weekends for significant issues that cannot wait till the next day.  The emergency department should be used for significant potentially life-threatening emergencies.  The emergency department is expensive, can often have long wait times for less significant concerns, so try to utilize primary care, urgent care, or telemedicine when possible to avoid unnecessary trips to the emergency department.  Virtual visits and telemedicine have been introduced since the pandemic started in 2020, and can be convenient ways to receive medical care.  We offer virtual appointments as well to assist you in a variety of options to seek medical care.   Advanced Directives: I recommend you consider completing a Health Care Power of Attorney and Living Will.  These documents respect your wishes and help alleviate burdens on your loved ones if you were to become terminally ill or be in a position to need those documents enforced.    You can complete Advanced Directives yourself, have them notarized, then have copies made for our office, for you and for anybody you feel should have them in safe keeping.  Or, you can have an attorney prepare these documents.   If you haven't updated your Last Will and Testament in a while, it may be worthwhile having an attorney prepare these documents together and save on some costs.       Separate significant issues discussed: Cough, chronic sinusitis-begin medications today Augmentin and cough medicine as below, good hydration daily.  If not completely resolved in 2 weeks then consider baseline chest x-ray  Travel to [place] Reviewed CDC travel recommendations for [place] Based on our discussion today you do not need typhoid or malaria prophylaxis You may want to consider getting cholera vaccine from the health department Avoid  drinking local water, drink only bottled water or treated water Avoid local drinks May with local juices as that could also be a source of contamination Return at your convenience for vaccines including hepatitis A vaccine, flu shot and COVID-vaccine I recommend taking an antibiotic called doxycycline with you on your trip to [place].  This vaccine can be used for skin infections, tick borne illness, and general respiratory tract infection I also prescribed mupirocin antibiotic ointment to take with you as a good topical antibiotic for skin infection  Joshua Torres was seen today for fasting cpe.  Diagnoses and all orders for this visit:  Encounter for health maintenance examination in adult -     VITAMIN D 25 Hydroxy (Vit-D Deficiency, Fractures) -     Comprehensive metabolic panel -     CBC -     Lipid panel -     POCT Urinalysis DIP (Proadvantage Device) -     CT CARDIAC SCORING ([facility] LOCATIONS ONLY); Future -     PSA  Gastroesophageal reflux disease, unspecified whether esophagitis present  Hyperlipidemia, unspecified hyperlipidemia type -     Lipid panel  History of prostatitis  Vitamin D deficiency -     VITAMIN D 25 Hydroxy (Vit-D Deficiency, Fractures)  Vaccine counseling  Chronic prostatitis  Screening for heart disease -     CT CARDIAC SCORING ([facility] LOCATIONS ONLY); Future  Chronic cough -     Spirometry with Graph  Chronic sinusitis, unspecified location -     Spirometry with Graph  Screening for prostate cancer -     PSA  Other orders -     amoxicillin-clavulanate (AUGMENTIN) 875-125 MG tablet; Take 1 tablet by mouth 2 (two) times daily. -     guaiFENesin-codeine 100-10 MG/5ML syrup; Take 5 mLs by mouth every 6 (six) hours as needed for cough. -     doxycycline (VIBRA-TABS) 100 MG tablet; Take 1 tablet (100 mg total) by mouth 2 (two) times daily. -     mupirocin ointment (BACTROBAN) 2 %; Apply 1 Application topically 2 (two) times daily.   Follow-up pending  labs, yearly for physical

## 2022-06-11 ENCOUNTER — Other Ambulatory Visit: Payer: Self-pay | Admitting: Medical

## 2022-06-11 LAB — LIPID PANEL
Chol/HDL Ratio: 2.7 ratio (ref 0.0–5.0)
Cholesterol, Total: 212 mg/dL — ABNORMAL HIGH (ref 100–199)
HDL: 79 mg/dL (ref >39)
LDL Chol Calc (NIH): 121 mg/dL — ABNORMAL HIGH (ref 0–99)
Triglycerides: 68 mg/dL (ref 0–149)
VLDL Cholesterol Cal: 12 mg/dL (ref 5–40)

## 2022-06-11 LAB — COMPREHENSIVE METABOLIC PANEL
ALT: 29 IU/L (ref 0–44)
AST: 28 IU/L (ref 0–40)
Albumin/Globulin Ratio: 1.7 (ref 1.2–2.2)
Albumin: 4.3 g/dL (ref 3.9–4.9)
Alkaline Phosphatase: 121 IU/L (ref 44–121)
BUN/Creatinine Ratio: 13 (ref 10–24)
BUN: 13 mg/dL (ref 8–27)
Bilirubin Total: 1.7 mg/dL — ABNORMAL HIGH (ref 0.0–1.2)
CO2: 23 mmol/L (ref 20–29)
Calcium: 9.4 mg/dL (ref 8.6–10.2)
Chloride: 103 mmol/L (ref 96–106)
Creatinine, Ser: 1.01 mg/dL (ref 0.76–1.27)
Globulin, Total: 2.6 g/dL (ref 1.5–4.5)
Glucose: 86 mg/dL (ref 70–99)
Potassium: 4.1 mmol/L (ref 3.5–5.2)
Sodium: 141 mmol/L (ref 134–144)
Total Protein: 6.9 g/dL (ref 6.0–8.5)
eGFR: 84 mL/min/{1.73_m2} (ref >59)

## 2022-06-11 LAB — CBC
Hematocrit: 49.3 % (ref 37.5–51.0)
Hemoglobin: 16.3 g/dL (ref 13.0–17.7)
MCH: 29 pg (ref 26.6–33.0)
MCHC: 33.1 g/dL (ref 31.5–35.7)
MCV: 88 fL (ref 79–97)
Platelets: 173 10*3/uL (ref 150–450)
RBC: 5.62 x10E6/uL (ref 4.14–5.80)
RDW: 13.1 % (ref 11.6–15.4)
WBC: 4.4 10*3/uL (ref 3.4–10.8)

## 2022-06-11 LAB — VITAMIN D 25 HYDROXY (VIT D DEFICIENCY, FRACTURES): Vit D, 25-Hydroxy: 38.7 ng/mL (ref 30.0–100.0)

## 2022-06-11 LAB — PSA: Prostate Specific Ag, Serum: 0.7 ng/mL (ref 0.0–4.0)

## 2022-06-11 MED ORDER — VITAMIN D 50 MCG (2000 UT) PO CAPS: 1.0000 | ORAL_CAPSULE | Freq: Every day | ORAL | 3 refills | Status: DC

## 2022-06-11 MED ORDER — PRAVASTATIN SODIUM 20 MG PO TABS: 20.0000 mg | ORAL_TABLET | Freq: Every evening | ORAL | 3 refills | Status: DC

## 2022-06-25 ENCOUNTER — Other Ambulatory Visit: Payer: Self-pay | Admitting: Medical

## 2022-06-25 ENCOUNTER — Telehealth: Payer: Self-pay | Admitting: Medical

## 2022-06-25 DIAGNOSIS — R053 Chronic cough: Secondary | ICD-10-CM

## 2022-06-25 NOTE — Telephone Encounter (Signed)
Pt called and states that he is still having some sinus issues and a cough and he feels like it is in his chest, he finished his amoxicillin and has been taking mucinex but it is still there, He wants to know if he needs another round of medicine Pt can be reached at [phone]

## 2022-06-25 NOTE — Progress Notes (Signed)
d 

## 2022-06-25 NOTE — Telephone Encounter (Signed)
Pt was notified and will go get xray tomorrow 06/26/2022  Pt is having sinus drainage, chest congestion, cough and coughing up mucous

## 2022-06-26 ENCOUNTER — Ambulatory Visit
Admission: RE | Admit: 2022-06-26 | Discharge: 2022-06-26 | Disposition: A | Discharge status: Home / Self Care | Payer: BC Managed Care – PPO | Source: Ambulatory Visit | Attending: Medical | Admitting: Medical

## 2022-06-26 DIAGNOSIS — R053 Chronic cough: Secondary | ICD-10-CM

## 2022-06-28 ENCOUNTER — Other Ambulatory Visit: Payer: Self-pay | Admitting: Medical

## 2022-06-28 MED ORDER — HYDROCODONE BIT-HOMATROP MBR 5-1.5 MG/5ML PO SOLN: 5.0000 mL | Freq: Three times a day (TID) | ORAL | 0 refills | Status: AC | PRN

## 2022-06-28 MED ORDER — PREDNISONE 10 MG PO TABS: 20.0000 mg | ORAL_TABLET | Freq: Every day | ORAL | 0 refills | Status: DC

## 2022-06-30 ENCOUNTER — Encounter: Payer: Self-pay | Admitting: Internal Medicine

## 2022-07-08 ENCOUNTER — Ambulatory Visit
Admission: RE | Admit: 2022-07-08 | Discharge: 2022-07-08 | Disposition: A | Discharge status: Home / Self Care | Payer: No Typology Code available for payment source | Source: Ambulatory Visit | Attending: Medical | Admitting: Medical

## 2022-07-08 DIAGNOSIS — Z136 Encounter for screening for cardiovascular disorders: Secondary | ICD-10-CM

## 2022-07-08 DIAGNOSIS — Z0000 Encounter for general adult medical examination without abnormal findings: Secondary | ICD-10-CM

## 2022-08-27 NOTE — Telephone Encounter (Signed)
Dt  

## 2023-06-14 ENCOUNTER — Encounter: Payer: BC Managed Care – PPO | Admitting: Medical

## 2023-08-11 ENCOUNTER — Encounter: Payer: Self-pay | Admitting: Medical

## 2023-08-11 ENCOUNTER — Ambulatory Visit: Payer: BC Managed Care – PPO | Admitting: Medical

## 2023-08-11 VITALS — BP 120/64 | HR 71 | Ht 73.0 in | Wt 210.2 lb

## 2023-08-11 DIAGNOSIS — Z0000 Encounter for general adult medical examination without abnormal findings: Secondary | ICD-10-CM | POA: Diagnosis not present

## 2023-08-11 DIAGNOSIS — Z7185 Encounter for immunization safety counseling: Secondary | ICD-10-CM | POA: Diagnosis not present

## 2023-08-11 DIAGNOSIS — E785 Hyperlipidemia, unspecified: Secondary | ICD-10-CM | POA: Diagnosis not present

## 2023-08-11 DIAGNOSIS — Z1389 Encounter for screening for other disorder: Secondary | ICD-10-CM | POA: Diagnosis not present

## 2023-08-11 DIAGNOSIS — E559 Vitamin D deficiency, unspecified: Secondary | ICD-10-CM

## 2023-08-11 LAB — POCT URINALYSIS DIP (PROADVANTAGE DEVICE)
Bilirubin, UA: NEGATIVE
Glucose, UA: NEGATIVE mg/dL
Ketones, POC UA: NEGATIVE mg/dL
Leukocytes, UA: NEGATIVE
Nitrite, UA: NEGATIVE
Protein Ur, POC: NEGATIVE mg/dL
Specific Gravity, Urine: 1.005
Urobilinogen, Ur: NEGATIVE
pH, UA: 6 (ref 5.0–8.0)

## 2023-08-11 NOTE — Progress Notes (Unsigned)
Subjective: Chief Complaint  Patient presents with   fasting cpe    Fasting cpe, no concerns, declines flu and covid today    Medical Team: Dr. Nita Sickle, neurology Dr. Darnell Level, general surgery Dr. Betha Loa, hand surgery Dr. Sharl Ma, podiatry Dentist Eye doctor Noura Purpura, Kermit Balo, PA-C   Concerns: Doing fine, no issues.  Reviewed their medical, surgical, family, social, medication, and allergy history and updated chart as appropriate.  Past Medical History:  Diagnosis Date   GERD (gastroesophageal reflux disease)    Hyperlipidemia    Osteoarthritis    left middle finger   Prostatitis    Wears glasses     Past Surgical History:  Procedure Laterality Date   COLONOSCOPY  2018   polyps; Dr. Loreta Ave, repeat 5 years   COLONOSCOPY  10/2021   normal, repeat 10 years, Dr. Charna Elizabeth    Family History  Problem Relation Age of Onset   Cancer Father        brain   Stroke Father    Diabetes Sister    Diabetes Mother    Other Brother        death related to a fall, head contusion   Asthma Sister    Heart disease Neg Hx      Current Outpatient Medications:    Cholecalciferol (VITAMIN D) 50 MCG (2000 UT) CAPS, Take 1 capsule (2,000 Units total) by mouth daily., Disp: 90 capsule, Rfl: 3   Cyanocobalamin (VITAMIN B12 PO), Take by mouth., Disp: , Rfl:    pravastatin (PRAVACHOL) 20 MG tablet, Take 1 tablet (20 mg total) by mouth every evening., Disp: 90 tablet, Rfl: 3  Allergies  Allergen Reactions   Ciprofloxacin     Achilles tendon ache, finger aches    Review of Systems  Constitutional:  Negative for chills, fever, malaise/fatigue and weight loss.  HENT:  Negative for congestion, ear pain, hearing loss, sore throat and tinnitus.   Eyes:  Negative for blurred vision, pain and redness.  Respiratory:  Negative for cough, hemoptysis and shortness of breath.   Cardiovascular:  Negative for chest pain, palpitations, orthopnea, claudication and leg swelling.   Gastrointestinal:  Negative for abdominal pain, blood in stool, constipation, diarrhea, nausea and vomiting.  Genitourinary:  Negative for dysuria, flank pain, frequency, hematuria and urgency.  Musculoskeletal:  Negative for falls, joint pain and myalgias.  Skin:  Negative for itching and rash.  Neurological:  Negative for dizziness, tingling, speech change, weakness and headaches.  Endo/Heme/Allergies:  Negative for polydipsia. Does not bruise/bleed easily.  Psychiatric/Behavioral:  Negative for depression and memory loss. The patient is not nervous/anxious and does not have insomnia.         08/11/2023    1:34 PM 06/10/2022    8:39 AM 09/25/2021    9:33 AM 08/19/2021    8:21 AM 06/09/2021    8:33 AM  Depression screen PHQ 2/9  Decreased Interest 0 0 0 0 0  Down, Depressed, Hopeless 0 0 0 0 0  PHQ - 2 Score 0 0 0 0 0        Objective:  BP 120/64   Pulse 71   Ht 6\' 1"  (1.854 m)   Wt 210 lb 3.2 oz (95.3 kg)   BMI 27.73 kg/m   General appearance: alert, no distress, WD/WN, African American male Skin: unremarkable HEENT: normocephalic, conjunctiva/corneas normal, sclerae anicteric, PERRLA, EOMi, nares with turbinated edema, no discharge or erythema, pharynx normal Oral cavity: MMM, tongue normal, teeth normal Neck:  supple, no lymphadenopathy, no thyromegaly, no masses, normal ROM, no bruits Chest: non tender, normal shape and expansion Heart: RRR, normal S1, S2, no murmurs Lungs: somewhat decrees breath sounds, no wheezes, rhonchi, or rales Abdomen: +bs, soft, non tender, non distended, no masses, no hepatomegaly, no splenomegaly, no bruits Back: non tender, normal ROM, no scoliosis Musculoskeletal: upper extremities non tender, no obvious deformity, normal ROM throughout, lower extremities non tender, no obvious deformity, normal ROM throughout Extremities: no edema, no cyanosis, no clubbing Pulses: 2+ symmetric, upper and lower extremities, normal cap refill Neurological:  alert, oriented x 3, CN2-12 intact, strength normal upper extremities and lower extremities, sensation normal throughout, DTRs 2+ throughout, no cerebellar signs, gait normal Psychiatric: normal affect, behavior normal, pleasant  GU: normal male external genitalia,circumcised, nontender, no masses, no hernia, no lymphadenopathy Rectal: deferred , declined    Assessment and Plan :   Encounter Diagnoses  Name Primary?   Encounter for health maintenance examination in adult Yes   Vitamin D deficiency    Vaccine counseling    Hyperlipidemia, unspecified hyperlipidemia type    Screening for hematuria or proteinuria     This visit was a preventative care visit, also known as wellness visit or routine physical.   Topics typically include healthy lifestyle, diet, exercise, preventative care, vaccinations, sick and well care, proper use of emergency dept and after hours care, as well as other concerns.     Recommendations: Continue to return yearly for your annual wellness and preventative care visits.  This gives [name] a chance to discuss healthy lifestyle, exercise, vaccinations, review your chart record, and perform screenings where appropriate.  I recommend you see your eye doctor yearly for routine vision care.  I recommend you see your dentist yearly for routine dental care including hygiene visits twice yearly.   Vaccination recommendations were reviewed Immunization History  Administered Date(s) Administered   Influenza,inj,Quad PF,6+ Mos 05/16/2019, 05/16/2020, 06/09/2021   Moderna Sars-Covid-2 Vaccination 01/26/2020, 02/23/2020   Td 04/29/2015   Zoster Recombinant(Shingrix) 06/10/2019, 08/08/2019    Screening for cancer: Colon cancer screening: I reviewed your colonoscopy on file that is up to date from 10/2021 showing internal hemorrhoids, otherwise normal.  We discussed PSA, prostate exam, and prostate cancer screening risks/benefits.     Skin cancer screening: Check your  skin regularly for new changes, growing lesions, or other lesions of concern Come in for evaluation if you have skin lesions of concern.  Lung cancer screening: If you have a greater than 20 pack year history of tobacco use, then you may qualify for lung cancer screening with a chest CT scan.   Please call your insurance company to inquire about coverage for this test.  We currently don't have screenings for other cancers besides breast, cervical, colon, and lung cancers.  If you have a strong family history of cancer or have other cancer screening concerns, please let me know.    Bone health: Get at least 150 minutes of aerobic exercise weekly Get weight bearing exercise at least once weekly Bone density test:  A bone density test is an imaging test that uses a type of X-ray to measure the amount of calcium and other minerals in your bones. The test may be used to diagnose or screen you for a condition that causes weak or thin bones (osteoporosis), predict your risk for a broken bone (fracture), or determine how well your osteoporosis treatment is working. The bone density test is recommended for females 65 and older, or females  or males <65 if certain risk factors such as thyroid disease, long term use of steroids such as for asthma or rheumatological issues, vitamin D deficiency, estrogen deficiency, family history of osteoporosis, self or family history of fragility fracture in first degree relative.    Heart health: Get at least 150 minutes of aerobic exercise weekly Limit alcohol It is important to maintain a healthy blood pressure and healthy cholesterol numbers  Heart disease screening: Screening for heart disease includes screening for blood pressure, fasting lipids, glucose/diabetes screening, BMI height to weight ratio, reviewed of smoking status, physical activity, and diet.    Goals include blood pressure 120/80 or less, maintaining a healthy lipid/cholesterol profile,  preventing diabetes or keeping diabetes numbers under good control, not smoking or using tobacco products, exercising most days per week or at least 150 minutes per week of exercise, and eating healthy variety of fruits and vegetables, healthy oils, and avoiding unhealthy food choices like fried food, fast food, high sugar and high cholesterol foods.    Other tests may possibly include EKG test, CT coronary calcium score, echocardiogram, exercise treadmill stress test.   Normal ABI 09/2020  CT coronary score 06/2022 , Agatston score of 85    Medical care options: I recommend you continue to seek care here first for routine care.  We try really hard to have available appointments Monday through Friday daytime hours for sick visits, acute visits, and physicals.  Urgent care should be used for after hours and weekends for significant issues that cannot wait till the next day.  The emergency department should be used for significant potentially life-threatening emergencies.  The emergency department is expensive, can often have long wait times for less significant concerns, so try to utilize primary care, urgent care, or telemedicine when possible to avoid unnecessary trips to the emergency department.  Virtual visits and telemedicine have been introduced since the pandemic started in 2020, and can be convenient ways to receive medical care.  We offer virtual appointments as well to assist you in a variety of options to seek medical care.   Advanced Directives: I recommend you consider completing a Health Care Power of Attorney and Living Will.   These documents respect your wishes and help alleviate burdens on your loved ones if you were to become terminally ill or be in a position to need those documents enforced.    You can complete Advanced Directives yourself, have them notarized, then have copies made for our office, for you and for anybody you feel should have them in safe keeping.  Or, you can  have an attorney prepare these documents.   If you haven't updated your Last Will and Testament in a while, it may be worthwhile having an attorney prepare these documents together and save on some costs.       Separate significant issues discussed: Hyperlipdemia - continue statin, labs today  Vit D deficiency - labs today, continue supplement  Ronold was seen today for fasting cpe.  Diagnoses and all orders for this visit:  Encounter for health maintenance examination in adult -     Comprehensive metabolic panel -     CBC -     PSA -     Lipid panel -     VITAMIN D 25 Hydroxy (Vit-D Deficiency, Fractures) -     POCT Urinalysis DIP (Proadvantage Device)  Vitamin D deficiency -     VITAMIN D 25 Hydroxy (Vit-D Deficiency, Fractures)  Vaccine counseling  Hyperlipidemia, unspecified  hyperlipidemia type -     Lipid panel  Screening for hematuria or proteinuria -     POCT Urinalysis DIP (Proadvantage Device)   Follow-up pending labs, yearly for physical

## 2023-08-12 ENCOUNTER — Other Ambulatory Visit: Payer: Self-pay | Admitting: Medical

## 2023-08-12 LAB — CBC
Hematocrit: 46 % (ref 37.5–51.0)
Hemoglobin: 15.5 g/dL (ref 13.0–17.7)
MCH: 29.6 pg (ref 26.6–33.0)
MCHC: 33.7 g/dL (ref 31.5–35.7)
MCV: 88 fL (ref 79–97)
Platelets: 163 10*3/uL (ref 150–450)
RBC: 5.24 x10E6/uL (ref 4.14–5.80)
RDW: 12.4 % (ref 11.6–15.4)
WBC: 8.2 10*3/uL (ref 3.4–10.8)

## 2023-08-12 LAB — COMPREHENSIVE METABOLIC PANEL
ALT: 26 [IU]/L (ref 0–44)
AST: 21 IU/L (ref 0–40)
Albumin: 4.2 g/dL (ref 3.9–4.9)
Alkaline Phosphatase: 110 IU/L (ref 44–121)
BUN/Creatinine Ratio: 18 (ref 10–24)
BUN: 19 mg/dL (ref 8–27)
Bilirubin Total: 1.4 mg/dL — ABNORMAL HIGH (ref 0.0–1.2)
CO2: 22 mmol/L (ref 20–29)
Calcium: 9 mg/dL (ref 8.6–10.2)
Chloride: 103 mmol/L (ref 96–106)
Creatinine, Ser: 1.07 mg/dL (ref 0.76–1.27)
Globulin, Total: 2.7 g/dL (ref 1.5–4.5)
Glucose: 81 mg/dL (ref 70–99)
Potassium: 3.9 mmol/L (ref 3.5–5.2)
Sodium: 138 mmol/L (ref 134–144)
Total Protein: 6.9 g/dL (ref 6.0–8.5)
eGFR: 78 mL/min/{1.73_m2} (ref >59)

## 2023-08-12 LAB — LIPID PANEL
Chol/HDL Ratio: 2.3 ratio (ref 0.0–5.0)
Cholesterol, Total: 188 mg/dL (ref 100–199)
HDL: 82 mg/dL (ref >39)
LDL Chol Calc (NIH): 94 mg/dL (ref 0–99)
Triglycerides: 62 mg/dL (ref 0–149)
VLDL Cholesterol Cal: 12 mg/dL (ref 5–40)

## 2023-08-12 LAB — VITAMIN D 25 HYDROXY (VIT D DEFICIENCY, FRACTURES): Vit D, 25-Hydroxy: 29.4 ng/mL — ABNORMAL LOW (ref 30.0–100.0)

## 2023-08-12 LAB — PSA: Prostate Specific Ag, Serum: 0.6 ng/mL (ref 0.0–4.0)

## 2023-08-12 MED ORDER — PRAVASTATIN SODIUM 20 MG PO TABS: 20.0000 mg | ORAL_TABLET | Freq: Every evening | ORAL | 3 refills | Status: DC

## 2023-08-12 MED ORDER — VITAMIN D (ERGOCALCIFEROL) 1.25 MG (50000 UNIT) PO CAPS: 50000.0000 [IU] | ORAL_CAPSULE | ORAL | 3 refills | Status: DC

## 2023-08-12 NOTE — Progress Notes (Signed)
Results sent through MyChart

## 2023-08-18 ENCOUNTER — Telehealth: Payer: Self-pay | Admitting: Medical

## 2023-08-18 NOTE — Telephone Encounter (Signed)
We need a Clean Catch Urine which is the 1st urine of the morning. Afternoon urine will not be a clean catch

## 2023-08-18 NOTE — Telephone Encounter (Signed)
Ordean called to schedule a lab visit to come back to leave a urine but he only wants to come late afternoon, Your message implies for him to come in the morning and he refused to come in the morning so would afternoon be ok ?

## 2023-09-06 ENCOUNTER — Other Ambulatory Visit (INDEPENDENT_AMBULATORY_CARE_PROVIDER_SITE_OTHER): Payer: BC Managed Care – PPO

## 2023-09-06 DIAGNOSIS — R8290 Unspecified abnormal findings in urine: Secondary | ICD-10-CM | POA: Diagnosis not present

## 2023-09-06 LAB — POCT URINALYSIS DIP (CLINITEK)
Bilirubin, UA: NEGATIVE
Blood, UA: NEGATIVE
Glucose, UA: NEGATIVE mg/dL
Ketones, POC UA: NEGATIVE mg/dL
Leukocytes, UA: NEGATIVE
Nitrite, UA: NEGATIVE
POC PROTEIN,UA: NEGATIVE
Spec Grav, UA: 1.01 (ref 1.010–1.025)
Urobilinogen, UA: 0.2 U/dL
pH, UA: 6.5 (ref 5.0–8.0)

## 2024-07-17 ENCOUNTER — Ambulatory Visit: Admitting: Medical

## 2024-07-17 ENCOUNTER — Ambulatory Visit: Payer: Self-pay

## 2024-07-17 ENCOUNTER — Encounter: Payer: Self-pay | Admitting: Medical

## 2024-07-17 VITALS — BP 124/84 | HR 86 | Wt 217.2 lb

## 2024-07-17 DIAGNOSIS — M25512 Pain in left shoulder: Secondary | ICD-10-CM

## 2024-07-17 NOTE — Telephone Encounter (Signed)
 FYI Only or Action Required?: FYI only for provider.  Patient was last seen in primary care on 08/11/2023 by Joshua Alm RAMAN, PA-C.  Called Nurse Triage reporting Shoulder Injury.  Symptoms began several weeks ago.  Interventions attempted: left shoulder pain Ice/heat application.  Symptoms are: unchanged.  Triage Disposition: See Physician Within 24 Hours  Patient/caregiver understands and will follow disposition?: Yes             Copied from CRM [id number]. Topic: Clinical - Red Word Triage >> Jul 17, 2024  9:41 AM Joshua Torres wrote: Red Word that prompted transfer to Nurse Triage: Patient hurt left shoulder 3 weeks ago from lifting something. Pain level 6  Would like an appt Reason for Disposition  [1] MODERATE pain (e.g., interferes with normal activities) AND [2] high-risk adult (e.g., age > 60 years, osteoporosis, chronic steroid use)  Answer Assessment - Initial Assessment Questions 1. MECHANISM: How did the injury happen?     The day before the pain started he was mounting a TV and lifting.  2. ONSET: When did the injury happen? (e.g., minutes, hours ago)      3 weeks ago.  3. APPEARANCE of INJURY: What does the injury look like?      Appears normal.  4. SEVERITY: Can you move the shoulder normally?      Yes.  5. SIZE: For cuts, bruises, or swelling, ask: How large is it? (e.g., inches or centimeters;  entire joint)      N/A.  6. PAIN: Is there pain? If Yes, ask: How bad is the pain?  (Scale 0-10; or none, mild, moderate, severe)     Yes. 5-6/10. He applied an ice pack, no OTC medications taken.  7. TETANUS: For any breaks in the skin, ask: When was your last tetanus booster?     N/A.  8. OTHER SYMPTOMS: Do you have any other symptoms? (e.g., loss of sensation)     Denies chest pain, SOB, numbness/tingling, weakness.  Protocols used: Shoulder Injury-A-AH

## 2024-07-17 NOTE — Progress Notes (Signed)
 Subjective: Chief Complaint  Patient presents with   Acute Visit    Left shoulder pain 4 weeks since mounting and lifting.    Joshua Torres is a 63 year old male who presents with left shoulder pain.  He has been experiencing left shoulder pain for approximately four weeks, which began the morning after mounting a TV with assistance. He describes the pain as as sharp in the shoulder upon waking up the next day.  He denies any prior shoulder pain and has not experienced regular shoulder pain or pain during sleep before this incident. He has not engaged in any other activities that might have aggravated the shoulder, such as playing sports or lifting heavy objects, aside from the TV mounting.  For management, he has used a heating pad and taken ibuprofen occasionally, but not on a regular schedule, without significant relief.  His job involves driving most of the day, and he does not frequently lift heavy objects at work. He does not engage in activities like throwing balls that might strain the shoulder.  No other aggravating or relieving factors. No other complaint.   Past Medical History:  Diagnosis Date   GERD (gastroesophageal reflux disease)    Hyperlipidemia    Osteoarthritis    left middle finger   Prostatitis    Wears glasses    Current Outpatient Medications on File Prior to Visit  Medication Sig Dispense Refill   celecoxib (CELEBREX) 200 MG capsule Take 200 mg by mouth.     Cyanocobalamin (VITAMIN B12 PO) Take by mouth.     pravastatin  (PRAVACHOL ) 20 MG tablet Take 1 tablet (20 mg total) by mouth every evening. 90 tablet 3   Vitamin D , Ergocalciferol , (DRISDOL ) 1.25 MG (50000 UNIT) CAPS capsule Take 1 capsule (50,000 Units total) by mouth every 7 (seven) days. 12 capsule 3   No current facility-administered medications on file prior to visit.   ROS as in subjective    Objective: BP 124/84   Pulse 86   Wt 217 lb 3.2 oz (98.5 kg)   SpO2 98%   BMI 28.66 kg/m    Gen: wd, wn, nad Neck: Supple, nontender, normal range of motion, no mass, lymphadenopathy or thyromegaly MSK: left shoulder without tenderness to palpation no swelling, no deformity.  There is some pain noted with left shoulder overhead range of motion particularly flexion and abduction.  Otherwise range of motion normal without pain.  No laxity. Mild tenderness in the A/C joint area with resisted flexion and adduction.  Otherwise no pain with shoulder range of motion Rest of arms unremarkable Arms neurovascularly intact   Assessment: Encounter Diagnosis  Name Primary?   Acute pain of left shoulder Yes     Plan: We discussed symptoms and concerns.  I suspect some mild tendinitis versus bursitis.  He has no prior shoulder chronic pain history and has not had any recent injury.  We discussed arthritis would not be unexpected either but he has not been having pain up until now.  Advised a short conservative course of relative rest, cold therapy or can alternate cold and hot therapy, can use his Celebrex which he has plenty of at home twice a day for the next 5 to 7 days, avoid overhead motion for the next week.  If no improvement at all within the next 10 to 14 days despite these conservative measures he can come back and see sports medicine Dr. Vita here  Joshua Torres was seen today for acute visit.  Diagnoses and  all orders for this visit:  Acute pain of left shoulder    F/u prn

## 2024-08-09 ENCOUNTER — Other Ambulatory Visit: Payer: Self-pay | Admitting: Medical

## 2024-08-21 ENCOUNTER — Encounter: Payer: Self-pay | Admitting: Medical

## 2024-08-21 ENCOUNTER — Ambulatory Visit: Payer: BC Managed Care – PPO | Admitting: Medical

## 2024-08-21 VITALS — BP 120/80 | HR 83 | Ht 72.0 in | Wt 216.6 lb

## 2024-08-21 DIAGNOSIS — E785 Hyperlipidemia, unspecified: Secondary | ICD-10-CM | POA: Diagnosis not present

## 2024-08-21 DIAGNOSIS — Z7185 Encounter for immunization safety counseling: Secondary | ICD-10-CM

## 2024-08-21 DIAGNOSIS — M25512 Pain in left shoulder: Secondary | ICD-10-CM | POA: Insufficient documentation

## 2024-08-21 DIAGNOSIS — Z0000 Encounter for general adult medical examination without abnormal findings: Secondary | ICD-10-CM

## 2024-08-21 DIAGNOSIS — Z125 Encounter for screening for malignant neoplasm of prostate: Secondary | ICD-10-CM

## 2024-08-21 DIAGNOSIS — Z1389 Encounter for screening for other disorder: Secondary | ICD-10-CM

## 2024-08-21 DIAGNOSIS — E559 Vitamin D deficiency, unspecified: Secondary | ICD-10-CM

## 2024-08-21 LAB — LIPID PANEL

## 2024-08-21 NOTE — Patient Instructions (Signed)
 This visit was a preventative care visit, also known as wellness visit or routine physical.   Topics typically include healthy lifestyle, diet, exercise, preventative care, vaccinations, sick and well care, proper use of emergency dept and after hours care, as well as other concerns.     Recommendations: Continue to return yearly for your annual wellness and preventative care visits.  This gives us  a chance to discuss healthy lifestyle, exercise, vaccinations, review your chart record, and perform screenings where appropriate.  I recommend you see your eye doctor yearly for routine vision care.  I recommend you see your dentist yearly for routine dental care including hygiene visits twice yearly.   Vaccination recommendations were reviewed Immunization History  Administered Date(s) Administered   Influenza,inj,Quad PF,6+ Mos 05/16/2019, 05/16/2020, 06/09/2021   Moderna Sars-Covid-2 Vaccination 01/26/2020, 02/23/2020   Td 04/29/2015   Zoster Recombinant(Shingrix) 06/10/2019, 08/08/2019   Consider flu vaccine, prevnar 20.  Due tdap 2026   Screening for cancer: Colon cancer screening: I reviewed your colonoscopy on file that is up to date from 10/2021 showing internal hemorrhoids, otherwise normal.  We discussed PSA, prostate exam, and prostate cancer screening risks/benefits.     Skin cancer screening: Check your skin regularly for new changes, growing lesions, or other lesions of concern Come in for evaluation if you have skin lesions of concern.  Lung cancer screening: If you have a greater than 20 pack year history of tobacco use, then you may qualify for lung cancer screening with a chest CT scan.   Please call your insurance company to inquire about coverage for this test.  We currently don't have screenings for other cancers besides breast, cervical, colon, and lung cancers.  If you have a strong family history of cancer or have other cancer screening concerns, please let me  know.    Bone health: Get at least 150 minutes of aerobic exercise weekly Get weight bearing exercise at least once weekly Bone density test:  A bone density test is an imaging test that uses a type of X-ray to measure the amount of calcium  and other minerals in your bones. The test may be used to diagnose or screen you for a condition that causes weak or thin bones (osteoporosis), predict your risk for a broken bone (fracture), or determine how well your osteoporosis treatment is working. The bone density test is recommended for females 65 and older, or females or males <65 if certain risk factors such as thyroid disease, long term use of steroids such as for asthma or rheumatological issues, vitamin D  deficiency, estrogen deficiency, family history of osteoporosis, self or family history of fragility fracture in first degree relative.    Heart health: Get at least 150 minutes of aerobic exercise weekly Limit alcohol It is important to maintain a healthy blood pressure and healthy cholesterol numbers  Heart disease screening: Screening for heart disease includes screening for blood pressure, fasting lipids, glucose/diabetes screening, BMI height to weight ratio, reviewed of smoking status, physical activity, and diet.    Goals include blood pressure 120/80 or less, maintaining a healthy lipid/cholesterol profile, preventing diabetes or keeping diabetes numbers under good control, not smoking or using tobacco products, exercising most days per week or at least 150 minutes per week of exercise, and eating healthy variety of fruits and vegetables, healthy oils, and avoiding unhealthy food choices like fried food, fast food, high sugar and high cholesterol foods.    Other tests may possibly include EKG test, CT coronary calcium  score,  echocardiogram, exercise treadmill stress test.   Normal ABI 09/2020  CT coronary score 06/2022 , Agatston score of 85    Medical care options: I recommend  you continue to seek care here first for routine care.  We try really hard to have available appointments Monday through Friday daytime hours for sick visits, acute visits, and physicals.  Urgent care should be used for after hours and weekends for significant issues that cannot wait till the next day.  The emergency department should be used for significant potentially life-threatening emergencies.  The emergency department is expensive, can often have long wait times for less significant concerns, so try to utilize primary care, urgent care, or telemedicine when possible to avoid unnecessary trips to the emergency department.  Virtual visits and telemedicine have been introduced since the pandemic started in 2020, and can be convenient ways to receive medical care.  We offer virtual appointments as well to assist you in a variety of options to seek medical care.   Advanced Directives: I recommend you consider completing a Health Care Power of Attorney and Living Will.   These documents respect your wishes and help alleviate burdens on your loved ones if you were to become terminally ill or be in a position to need those documents enforced.    You can complete Advanced Directives yourself, have them notarized, then have copies made for our office, for you and for anybody you feel should have them in safe keeping.  Or, you can have an attorney prepare these documents.   If you haven't updated your Last Will and Testament in a while, it may be worthwhile having an attorney prepare these documents together and save on some costs.       Separate significant issues discussed: Hyperlipdemia - continue statin, labs today  Vit D deficiency - labs today, continue supplement  Shoulder pain - follow up here soon for Dr. Vita for further eval

## 2024-08-21 NOTE — Progress Notes (Signed)
 Subjective: Chief Complaint  Patient presents with   Annual Exam    Fasting cpe, pain in left shoulder     Medical Team: Dr. Tonita Blanch, neurology Dr. Fonda Redbird, back specialist Dr. Krystal Spinner, general surgery Dr. Franky Curia, hand surgery Dr. Juliene Medicine, podiatry Dentist Eye doctor Maximilliano Kersh, Alm RAMAN, PA-C   Concerns: He is a 63 year old male who presents with left shoulder pain.  He has been experiencing pain in his left shoulder, specifically around the [facility] joint, for a couple of weeks. He has taken Celecoxib for the pain, which provided some relief, but he is not keen on continuing this medication due to its side effects. He has not used  an arm sling for the shoulder pain but has used some cold therapy.  His current medications include Pravachol  for cholesterol, a weekly prescription of Vitamin D , and B12 supplements. He is allergic to Cipro .  He does not smoke or consume alcohol. He lives in a two-story house and continues to work as a naval architect, incorporating exercise into his routine as much as possible.  No new symptoms or concerns beyond the shoulder pain.   Reviewed their medical, surgical, family, social, medication, and allergy history and updated chart as appropriate.  Past Medical History:  Diagnosis Date   GERD (gastroesophageal reflux disease)    Hyperlipidemia    Osteoarthritis    left middle finger   Prostatitis    Wears glasses     Past Surgical History:  Procedure Laterality Date   COLONOSCOPY  2018   polyps; Dr. Kristie, repeat 5 years   COLONOSCOPY  10/2021   normal, repeat 10 years, Dr. Renaye Kristie    Family History  Problem Relation Age of Onset   Cancer Father        brain   Stroke Father    Diabetes Sister    Diabetes Mother    Other Brother        death related to a fall, head contusion   Asthma Sister    Heart disease Neg Hx      Current Outpatient Medications:    celecoxib (CELEBREX) 200 MG capsule, Take 200 mg by  mouth., Disp: , Rfl:    Cyanocobalamin (VITAMIN B12 PO), Take by mouth., Disp: , Rfl:    pravastatin  (PRAVACHOL ) 20 MG tablet, Take 1 tablet (20 mg total) by mouth every evening., Disp: 90 tablet, Rfl: 3   Vitamin D , Ergocalciferol , (DRISDOL ) 1.25 MG (50000 UNIT) CAPS capsule, TAKE 1 CAPSULE (50,000 UNITS TOTAL) BY MOUTH EVERY 7 (SEVEN) DAYS, Disp: 12 capsule, Rfl: 0  Allergies  Allergen Reactions   Ciprofloxacin      Achilles tendon ache, finger aches    Review of Systems  Constitutional:  Negative for chills, fever, malaise/fatigue and weight loss.  HENT:  Negative for congestion, ear pain, hearing loss, sore throat and tinnitus.   Eyes:  Negative for blurred vision, pain and redness.  Respiratory:  Negative for cough, hemoptysis and shortness of breath.   Cardiovascular:  Negative for chest pain, palpitations, orthopnea, claudication and leg swelling.  Gastrointestinal:  Negative for abdominal pain, blood in stool, constipation, diarrhea, nausea and vomiting.  Genitourinary:  Negative for dysuria, flank pain, frequency, hematuria and urgency.  Musculoskeletal:  Positive for back pain and joint pain. Negative for falls and myalgias.  Skin:  Negative for itching and rash.  Neurological:  Negative for dizziness, tingling, speech change, weakness and headaches.  Endo/Heme/Allergies:  Negative for polydipsia. Does not bruise/bleed  easily.  Psychiatric/Behavioral:  Negative for depression and memory loss. The patient is not nervous/anxious and does not have insomnia.         08/21/2024    3:24 PM 08/11/2023    1:34 PM 06/10/2022    8:39 AM 09/25/2021    9:33 AM 08/19/2021    8:21 AM  Depression screen PHQ 2/9  Decreased Interest 0 0 0 0 0  Down, Depressed, Hopeless 0 0 0 0 0  PHQ - 2 Score 0 0 0 0 0        Objective:  BP 120/80   Pulse 83   Ht 6' (1.829 m)   Wt 216 lb 9.6 oz (98.2 kg)   SpO2 97%   BMI 29.38 kg/m   General appearance: alert, no distress, WD/WN, African  American male Skin: unremarkable HEENT: normocephalic, conjunctiva/corneas normal, sclerae anicteric, PERRLA, EOMi, nares with turbinated edema, no discharge or erythema, pharynx normal Oral cavity: MMM, tongue normal, teeth normal Neck: supple, no lymphadenopathy, no thyromegaly, no masses, normal ROM, no bruits Chest: non tender, normal shape and expansion Heart: RRR, normal S1, S2, no murmurs Lungs: somewhat decrees breath sounds, no wheezes, rhonchi, or rales Abdomen: +bs, soft, non tender, non distended, no masses, no hepatomegaly, no splenomegaly, no bruits Back: non tender, normal ROM, no scoliosis Musculoskeletal: tender left AC joint, otherwise nontender, no swelling or deformity, otherwise, upper extremities non tender, no obvious deformity, normal ROM throughout, lower extremities non tender, no obvious deformity, normal ROM throughout Extremities: no edema, no cyanosis, no clubbing Pulses: 2+ symmetric, upper and lower extremities, normal cap refill Neurological: alert, oriented x 3, CN2-12 intact, strength normal upper extremities and lower extremities, sensation normal throughout, DTRs 2+ throughout, no cerebellar signs, gait normal Psychiatric: normal affect, behavior normal, pleasant  GU: declined Rectal: deferred , declined    Assessment and Plan :   Encounter Diagnoses  Name Primary?   Encounter for health maintenance examination in adult Yes   Vitamin D  deficiency    Hyperlipidemia, unspecified hyperlipidemia type    Screening for prostate cancer    Screening for hematuria or proteinuria    Vaccine counseling    Left shoulder pain, unspecified chronicity     This visit was a preventative care visit, also known as wellness visit or routine physical.   Topics typically include healthy lifestyle, diet, exercise, preventative care, vaccinations, sick and well care, proper use of emergency dept and after hours care, as well as other concerns.      Recommendations: Continue to return yearly for your annual wellness and preventative care visits.  This gives us  a chance to discuss healthy lifestyle, exercise, vaccinations, review your chart record, and perform screenings where appropriate.  I recommend you see your eye doctor yearly for routine vision care.  I recommend you see your dentist yearly for routine dental care including hygiene visits twice yearly.   Vaccination recommendations were reviewed Immunization History  Administered Date(s) Administered   Influenza,inj,Quad PF,6+ Mos 05/16/2019, 05/16/2020, 06/09/2021   Moderna Sars-Covid-2 Vaccination 01/26/2020, 02/23/2020   Td 04/29/2015   Zoster Recombinant(Shingrix) 06/10/2019, 08/08/2019   Consider flu vaccine, Prevnar 20.  Due tdap 2026   Screening for cancer: Colon cancer screening: I reviewed your colonoscopy on file that is up to date from 10/2021 showing internal hemorrhoids, otherwise normal.  We discussed PSA, prostate exam, and prostate cancer screening risks/benefits.     Skin cancer screening: Check your skin regularly for new changes, growing lesions, or other lesions of  concern Come in for evaluation if you have skin lesions of concern.  Lung cancer screening: If you have a greater than 20 pack year history of tobacco use, then you may qualify for lung cancer screening with a chest CT scan.   Please call your insurance company to inquire about coverage for this test.  We currently don't have screenings for other cancers besides breast, cervical, colon, and lung cancers.  If you have a strong family history of cancer or have other cancer screening concerns, please let me know.    Bone health: Get at least 150 minutes of aerobic exercise weekly Get weight bearing exercise at least once weekly Bone density test:  A bone density test is an imaging test that uses a type of X-ray to measure the amount of calcium  and other minerals in your bones. The  test may be used to diagnose or screen you for a condition that causes weak or thin bones (osteoporosis), predict your risk for a broken bone (fracture), or determine how well your osteoporosis treatment is working. The bone density test is recommended for females 65 and older, or females or males <65 if certain risk factors such as thyroid disease, long term use of steroids such as for asthma or rheumatological issues, vitamin D  deficiency, estrogen deficiency, family history of osteoporosis, self or family history of fragility fracture in first degree relative.    Heart health: Get at least 150 minutes of aerobic exercise weekly Limit alcohol It is important to maintain a healthy blood pressure and healthy cholesterol numbers  Heart disease screening: Screening for heart disease includes screening for blood pressure, fasting lipids, glucose/diabetes screening, BMI height to weight ratio, reviewed of smoking status, physical activity, and diet.    Goals include blood pressure 120/80 or less, maintaining a healthy lipid/cholesterol profile, preventing diabetes or keeping diabetes numbers under good control, not smoking or using tobacco products, exercising most days per week or at least 150 minutes per week of exercise, and eating healthy variety of fruits and vegetables, healthy oils, and avoiding unhealthy food choices like fried food, fast food, high sugar and high cholesterol foods.    Other tests may possibly include EKG test, CT coronary calcium  score, echocardiogram, exercise treadmill stress test.   Normal ABI 09/2020  CT coronary score 06/2022 , Agatston score of 85    Medical care options: I recommend you continue to seek care here first for routine care.  We try really hard to have available appointments Monday through Friday daytime hours for sick visits, acute visits, and physicals.  Urgent care should be used for after hours and weekends for significant issues that cannot wait  till the next day.  The emergency department should be used for significant potentially life-threatening emergencies.  The emergency department is expensive, can often have long wait times for less significant concerns, so try to utilize primary care, urgent care, or telemedicine when possible to avoid unnecessary trips to the emergency department.  Virtual visits and telemedicine have been introduced since the pandemic started in 2020, and can be convenient ways to receive medical care.  We offer virtual appointments as well to assist you in a variety of options to seek medical care.   Advanced Directives: I recommend you consider completing a Health Care Power of Attorney and Living Will.   These documents respect your wishes and help alleviate burdens on your loved ones if you were to become terminally ill or be in a position to need those  documents enforced.    You can complete Advanced Directives yourself, have them notarized, then have copies made for our office, for you and for anybody you feel should have them in safe keeping.  Or, you can have an attorney prepare these documents.   If you haven't updated your Last Will and Testament in a while, it may be worthwhile having an attorney prepare these documents together and save on some costs.       Separate significant issues discussed: Hyperlipidemia - continue statin, labs today  Vit D deficiency - labs today, continue supplement  Shoulder pain - follow up here soon for Dr. Vita for further eval    Joshua Torres was seen today for annual exam.  Diagnoses and all orders for this visit:  Encounter for health maintenance examination in adult -     CBC -     Comprehensive metabolic panel with GFR -     Lipid panel -     PSA -     Urinalysis, Routine w reflex microscopic -     VITAMIN D  25 Hydroxy (Vit-D Deficiency, Fractures)  Vitamin D  deficiency -     VITAMIN D  25 Hydroxy (Vit-D Deficiency, Fractures)  Hyperlipidemia, unspecified  hyperlipidemia type -     Lipid panel  Screening for prostate cancer -     PSA  Screening for hematuria or proteinuria -     Urinalysis, Routine w reflex microscopic  Vaccine counseling  Left shoulder pain, unspecified chronicity   Follow-up pending labs, yearly for physical

## 2024-08-22 ENCOUNTER — Ambulatory Visit: Payer: Self-pay | Admitting: Medical

## 2024-08-22 ENCOUNTER — Other Ambulatory Visit: Payer: Self-pay | Admitting: Medical

## 2024-08-22 DIAGNOSIS — R748 Abnormal levels of other serum enzymes: Secondary | ICD-10-CM

## 2024-08-22 LAB — LIPID PANEL
Cholesterol, Total: 202 mg/dL — AB (ref 100–199)
HDL: 71 mg/dL (ref >39)
LDL CALC COMMENT:: 2.8 ratio (ref 0.0–5.0)
LDL Chol Calc (NIH): 107 mg/dL — AB (ref 0–99)
Triglycerides: 141 mg/dL (ref 0–149)
VLDL Cholesterol Cal: 24 mg/dL (ref 5–40)

## 2024-08-22 LAB — URINALYSIS, ROUTINE W REFLEX MICROSCOPIC
Specific Gravity, UA: 1.019 (ref 1.005–1.030)
Urobilinogen, Ur: 1 mg/dL (ref 0.2–1.0)
pH, UA: 5.5 (ref 5.0–7.5)

## 2024-08-22 LAB — COMPREHENSIVE METABOLIC PANEL WITH GFR
ALT: 30 IU/L (ref 0–44)
AST: 34 IU/L (ref 0–40)
Albumin: 4.4 g/dL (ref 3.9–4.9)
Alkaline Phosphatase: 156 IU/L — AB (ref 47–123)
BUN/Creatinine Ratio: 18 (ref 10–24)
BUN: 17 mg/dL (ref 8–27)
Bilirubin Total: 0.9 mg/dL (ref 0.0–1.2)
CO2: 19 mmol/L — AB (ref 20–29)
Calcium: 9 mg/dL (ref 8.6–10.2)
Chloride: 105 mmol/L (ref 96–106)
Creatinine, Ser: 0.96 mg/dL (ref 0.76–1.27)
Globulin, Total: 2.3 g/dL (ref 1.5–4.5)
Glucose: 86 mg/dL (ref 70–99)
Potassium: 3.9 mmol/L (ref 3.5–5.2)
Sodium: 138 mmol/L (ref 134–144)
Total Protein: 6.7 g/dL (ref 6.0–8.5)
eGFR: 88 mL/min/1.73 (ref >59)

## 2024-08-22 LAB — PSA: Prostate Specific Ag, Serum: 0.6 ng/mL (ref 0.0–4.0)

## 2024-08-22 LAB — CBC
Hematocrit: 46 % (ref 37.5–51.0)
Hemoglobin: 15.2 g/dL (ref 13.0–17.7)
MCH: 29.5 pg (ref 26.6–33.0)
MCHC: 33 g/dL (ref 31.5–35.7)
MCV: 89 fL (ref 79–97)
Platelets: 176 x10E3/uL (ref 150–450)
RBC: 5.16 x10E6/uL (ref 4.14–5.80)
RDW: 13.3 % (ref 11.6–15.4)
WBC: 5.6 x10E3/uL (ref 3.4–10.8)

## 2024-08-22 LAB — MICROSCOPIC EXAMINATION
Bacteria, UA: NONE SEEN
Casts: NONE SEEN /LPF
RBC, Urine: NONE SEEN /HPF (ref 0–2)

## 2024-08-22 LAB — VITAMIN D 25 HYDROXY (VIT D DEFICIENCY, FRACTURES): Vit D, 25-Hydroxy: 37.2 ng/mL (ref 30.0–100.0)

## 2024-08-22 MED ORDER — VITAMIN D (ERGOCALCIFEROL) 1.25 MG (50000 UNIT) PO CAPS: 50000.0000 [IU] | ORAL_CAPSULE | ORAL | 3 refills | Status: AC

## 2024-08-22 MED ORDER — PRAVASTATIN SODIUM 20 MG PO TABS: 20.0000 mg | ORAL_TABLET | Freq: Every evening | ORAL | 3 refills | Status: AC

## 2024-08-22 NOTE — Progress Notes (Signed)
 Results through MyChart

## 2024-09-11 ENCOUNTER — Encounter: Payer: Self-pay | Admitting: Family Medicine

## 2024-09-11 ENCOUNTER — Ambulatory Visit
Admission: RE | Admit: 2024-09-11 | Discharge: 2024-09-11 | Disposition: A | Source: Ambulatory Visit | Attending: Family Medicine

## 2024-09-11 ENCOUNTER — Ambulatory Visit: Admitting: Family Medicine

## 2024-09-11 VITALS — BP 128/88 | HR 74 | Wt 214.4 lb

## 2024-09-11 DIAGNOSIS — G8929 Other chronic pain: Secondary | ICD-10-CM | POA: Diagnosis not present

## 2024-09-11 DIAGNOSIS — M25512 Pain in left shoulder: Secondary | ICD-10-CM

## 2024-09-11 MED ORDER — MELOXICAM 15 MG PO TABS: 15.0000 mg | ORAL_TABLET | Freq: Every day | ORAL | 0 refills | Status: AC

## 2024-09-11 NOTE — Patient Instructions (Addendum)
 Please go get your xrays done at [facility]. You do not need to make an appointment. You can just show up.   Address: [address] [place], [place], [place] [address]   Meloxicam  once a day with food for 10-14 days  VISIT SUMMARY:  You came in today because of pain in your right shoulder, which started after some work around the house. The pain has been ongoing since October and is particularly bothersome when lying on your side or doing certain movements. You have a history of arthritis and have used Celebrex in the past, but are not currently taking any anti-inflammatory medications.  YOUR PLAN:  -RIGHT SHOULDER PAIN DUE TO ROTATOR CUFF INJURY AND POSSIBLE AC JOINT ARTHRITIS: Your shoulder pain is likely due to a rotator cuff injury rather than arthritis. We have ordered x-rays to rule out arthritis. You have been prescribed meloxicam  15 mg to take once daily with food for 10 days, which can be extended to 14 days if it helps. Please avoid taking ibuprofen or Advil while on meloxicam , but you can take Tylenol if needed. Additionally, you have been given home exercises and a resistance band to help strengthen your rotator cuff.  INSTRUCTIONS:  Please get the x-rays of your left shoulder done as soon as possible. Follow the medication instructions carefully and avoid taking ibuprofen or Advil while on meloxicam . Perform the home exercises regularly to help with your recovery. If the pain persists or worsens, please schedule a follow-up appointment.

## 2024-09-11 NOTE — Progress Notes (Signed)
" ° °  Name: Joshua Torres   Date of Visit: 09/11/2024   Date of last visit with me: Visit date not found   CHIEF COMPLAINT:  Chief Complaint  Patient presents with   Acute Visit    Left shoulder pain been going on sense October wants to know why it is still going on.        HPI:  Discussed the use of AI scribe software for clinical note transcription with the patient, who gave verbal consent to proceed.  History of Present Illness   Joshua Torres is a 64 year old male who presents with right shoulder pain.  He woke up one morning with pain in his right shoulder, which he suspects may be related to work he did around the house the day before the onset. The pain has decreased somewhat since it began in October, but it persists, especially when lying on his side or performing certain movements.  He has a history of arthritis and has previously used Celebrex as needed. Currently, he is not taking any other anti-inflammatory medications.         OBJECTIVE:       08/21/2024    3:24 PM  Depression screen PHQ 2/9  Decreased Interest 0  Down, Depressed, Hopeless 0  PHQ - 2 Score 0     BP Readings from Last 3 Encounters:  09/11/24 128/88  08/21/24 120/80  07/17/24 124/84    BP 128/88   Pulse 74   Wt 214 lb 6.4 oz (97.3 kg)   SpO2 98%   BMI 29.08 kg/m    Physical Exam   MUSCULOSKELETAL: Pain on shoulder flexion. Hopkins and crossover tests negative. Mild pain on shoulder adduction.      Physical Exam  ASSESSMENT/PLAN:   Assessment & Plan Chronic left shoulder pain    Assessment and Plan    Left shoulder pain due to rotator cuff injury and possible AC joint arthritis Pain likely from rotator cuff injury, not arthritis. X-rays ordered to rule out arthritis. - Ordered x-rays of the left shoulder. - Prescribed meloxicam  15 mg once daily with food for 10 days, extendable to 14 days if beneficial. - Advised against ibuprofen or Advil while on meloxicam ; Tylenol is  permissible. - Provided home exercises and a resistance band for rotator cuff strengthening. - Sent prescription to on [place].         Joshua Torres A. Vita MD [facility] "

## 2024-11-20 ENCOUNTER — Ambulatory Visit: Admitting: Medical

## 2024-11-27 ENCOUNTER — Ambulatory Visit: Admitting: Family Medicine

## 2025-08-21 ENCOUNTER — Encounter: Admitting: Medical
